# Patient Record
Sex: Female | Born: 1970 | Race: Black or African American | Hispanic: No | Smoking: Never smoker
Health system: Southern US, Community
[De-identification: ages and names within clinical notes are randomized; demographics above are authoritative.]

## PROBLEM LIST (undated history)

## (undated) ENCOUNTER — Telehealth

## (undated) ENCOUNTER — Encounter: Attending: Rheumatology | Primary: Rheumatology

## (undated) ENCOUNTER — Ambulatory Visit

## (undated) ENCOUNTER — Ambulatory Visit: Attending: Ambulatory Care | Primary: Ambulatory Care

## (undated) ENCOUNTER — Ambulatory Visit: Payer: BLUE CROSS/BLUE SHIELD | Attending: Rheumatology | Primary: Rheumatology

## (undated) ENCOUNTER — Ambulatory Visit: Payer: PRIVATE HEALTH INSURANCE

## (undated) ENCOUNTER — Telehealth: Attending: Ambulatory Care | Primary: Ambulatory Care

## (undated) ENCOUNTER — Encounter: Payer: PRIVATE HEALTH INSURANCE | Attending: Rheumatology | Primary: Rheumatology

## (undated) ENCOUNTER — Telehealth: Attending: Rheumatology | Primary: Rheumatology

## (undated) ENCOUNTER — Ambulatory Visit: Payer: PRIVATE HEALTH INSURANCE | Attending: Rheumatology | Primary: Rheumatology

## (undated) ENCOUNTER — Encounter

## (undated) ENCOUNTER — Encounter: Attending: Family | Primary: Family

## (undated) ENCOUNTER — Encounter: Attending: Medical | Primary: Medical

## (undated) DIAGNOSIS — D179 Benign lipomatous neoplasm, unspecified: Secondary | ICD-10-CM

## (undated) DIAGNOSIS — O2230 Deep phlebothrombosis in pregnancy, unspecified trimester: Secondary | ICD-10-CM

## (undated) DIAGNOSIS — B009 Herpesviral infection, unspecified: Secondary | ICD-10-CM

## (undated) DIAGNOSIS — F32A Depression, unspecified: Secondary | ICD-10-CM

## (undated) DIAGNOSIS — K219 Gastro-esophageal reflux disease without esophagitis: Secondary | ICD-10-CM

## (undated) DIAGNOSIS — M069 Rheumatoid arthritis, unspecified: Secondary | ICD-10-CM

## (undated) DIAGNOSIS — J45909 Unspecified asthma, uncomplicated: Secondary | ICD-10-CM

## (undated) DIAGNOSIS — E288 Other ovarian dysfunction: Secondary | ICD-10-CM

## (undated) DIAGNOSIS — I671 Cerebral aneurysm, nonruptured: Secondary | ICD-10-CM

## (undated) DIAGNOSIS — Z9289 Personal history of other medical treatment: Secondary | ICD-10-CM

## (undated) DIAGNOSIS — E785 Hyperlipidemia, unspecified: Secondary | ICD-10-CM

## (undated) DIAGNOSIS — A6000 Herpesviral infection of urogenital system, unspecified: Secondary | ICD-10-CM

## (undated) DIAGNOSIS — M359 Systemic involvement of connective tissue, unspecified: Secondary | ICD-10-CM

## (undated) DIAGNOSIS — S0300XA Dislocation of jaw, unspecified side, initial encounter: Secondary | ICD-10-CM

## (undated) DIAGNOSIS — I2699 Other pulmonary embolism without acute cor pulmonale: Secondary | ICD-10-CM

## (undated) DIAGNOSIS — D219 Benign neoplasm of connective and other soft tissue, unspecified: Secondary | ICD-10-CM

## (undated) DIAGNOSIS — N83209 Unspecified ovarian cyst, unspecified side: Secondary | ICD-10-CM

## (undated) DIAGNOSIS — F329 Major depressive disorder, single episode, unspecified: Secondary | ICD-10-CM

## (undated) HISTORY — DX: Benign lipomatous neoplasm, unspecified: D17.9

## (undated) HISTORY — PX: KNEE SURGERY: SHX244

## (undated) HISTORY — DX: Rheumatoid arthritis, unspecified: M06.9

## (undated) HISTORY — DX: Personal history of other medical treatment: Z92.89

## (undated) HISTORY — DX: Benign neoplasm of connective and other soft tissue, unspecified: D21.9

## (undated) HISTORY — DX: Herpesviral infection of urogenital system, unspecified: A60.00

## (undated) HISTORY — DX: Unspecified asthma, uncomplicated: J45.909

## (undated) HISTORY — DX: Depression, unspecified: F32.A

## (undated) HISTORY — PX: INDUCED ABORTION: SHX677

## (undated) HISTORY — DX: Major depressive disorder, single episode, unspecified: F32.9

## (undated) HISTORY — DX: Dislocation of jaw, unspecified side, initial encounter: S03.00XA

## (undated) HISTORY — DX: Deep phlebothrombosis in pregnancy, unspecified trimester: O22.30

## (undated) HISTORY — DX: Herpesviral infection, unspecified: B00.9

## (undated) HISTORY — DX: Cerebral aneurysm, nonruptured: I67.1

## (undated) HISTORY — PX: WISDOM TOOTH EXTRACTION: SHX21

## (undated) HISTORY — DX: Hyperlipidemia, unspecified: E78.5

## (undated) HISTORY — DX: Gastro-esophageal reflux disease without esophagitis: K21.9

## (undated) HISTORY — DX: Other ovarian dysfunction: E28.8

## (undated) HISTORY — DX: Unspecified ovarian cyst, unspecified side: N83.209

---
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
## 1995-05-21 HISTORY — PX: APPENDECTOMY: SHX54

## 2010-03-15 ENCOUNTER — Other Ambulatory Visit: Payer: Self-pay | Admitting: Family Medicine

## 2010-03-16 ENCOUNTER — Ambulatory Visit: Payer: Self-pay | Admitting: Family Medicine

## 2010-03-20 ENCOUNTER — Ambulatory Visit: Payer: Self-pay | Admitting: Family Medicine

## 2011-04-24 ENCOUNTER — Emergency Department: Payer: Self-pay | Admitting: Emergency Medicine

## 2011-04-24 ENCOUNTER — Ambulatory Visit: Payer: Self-pay

## 2011-05-21 DIAGNOSIS — E2839 Other primary ovarian failure: Secondary | ICD-10-CM

## 2011-05-21 HISTORY — DX: Other primary ovarian failure: E28.39

## 2011-06-25 ENCOUNTER — Ambulatory Visit: Payer: Self-pay | Admitting: Family Medicine

## 2011-06-25 ENCOUNTER — Ambulatory Visit
Admission: RE | Admit: 2011-06-25 | Discharge: 2011-06-25 | Disposition: A | Discharge status: Home / Self Care | Payer: BC Managed Care – PPO | Source: Ambulatory Visit | Attending: Family Medicine | Admitting: Family Medicine

## 2011-06-25 ENCOUNTER — Other Ambulatory Visit: Payer: Self-pay | Admitting: Family Medicine

## 2011-06-25 DIAGNOSIS — R102 Pelvic and perineal pain: Secondary | ICD-10-CM

## 2011-07-04 ENCOUNTER — Ambulatory Visit: Payer: Self-pay | Admitting: Family Medicine

## 2011-07-04 LAB — URINALYSIS, COMPLETE
Bilirubin,UR: NEGATIVE
Leukocyte Esterase: NEGATIVE
Nitrite: NEGATIVE
Ph: 6 (ref 4.5–8.0)
Protein: NEGATIVE
RBC,UR: NONE SEEN /HPF (ref 0–5)

## 2011-07-04 LAB — BASIC METABOLIC PANEL
Calcium, Total: 9.3 mg/dL (ref 8.5–10.1)
Chloride: 104 mmol/L (ref 98–107)
Osmolality: 277 (ref 275–301)
Potassium: 3.8 mmol/L (ref 3.5–5.1)

## 2011-07-04 LAB — WET PREP, GENITAL

## 2012-04-27 ENCOUNTER — Other Ambulatory Visit: Payer: Self-pay | Admitting: Family Medicine

## 2012-04-27 DIAGNOSIS — M25562 Pain in left knee: Secondary | ICD-10-CM

## 2012-04-28 ENCOUNTER — Ambulatory Visit: Payer: Self-pay | Admitting: Family Medicine

## 2012-04-30 ENCOUNTER — Ambulatory Visit
Admission: RE | Admit: 2012-04-30 | Discharge: 2012-04-30 | Disposition: A | Discharge status: Home / Self Care | Payer: BC Managed Care – PPO | Source: Ambulatory Visit | Attending: Family Medicine | Admitting: Family Medicine

## 2012-04-30 DIAGNOSIS — M25562 Pain in left knee: Secondary | ICD-10-CM

## 2013-05-05 ENCOUNTER — Ambulatory Visit: Payer: Self-pay | Admitting: Family Medicine

## 2013-05-05 ENCOUNTER — Ambulatory Visit: Payer: Self-pay | Admitting: Internal Medicine

## 2013-05-05 LAB — COMPREHENSIVE METABOLIC PANEL
Albumin: 3.2 g/dL — ABNORMAL LOW (ref 3.4–5.0)
Alkaline Phosphatase: 44 U/L — ABNORMAL LOW
BUN: 13 mg/dL (ref 7–18)
Bilirubin,Total: 0.4 mg/dL (ref 0.2–1.0)
Creatinine: 0.84 mg/dL (ref 0.60–1.30)
EGFR (Non-African Amer.): >60
Glucose: 105 mg/dL — ABNORMAL HIGH (ref 65–99)
Osmolality: 276 (ref 275–301)
SGOT(AST): 18 U/L (ref 15–37)
Total Protein: 7 g/dL (ref 6.4–8.2)

## 2013-05-05 LAB — CBC WITH DIFFERENTIAL/PLATELET
Eosinophil %: 1.4 %
MCV: 76 fL — ABNORMAL LOW (ref 80–100)
Monocyte %: 5.7 %
Neutrophil %: 66.1 %
RBC: 4.66 10*6/uL (ref 3.80–5.20)

## 2013-05-05 LAB — CK-MB: CK-MB: 1.7 ng/mL (ref 0.5–3.6)

## 2013-05-05 LAB — TROPONIN I: Troponin-I: <0.02 ng/mL

## 2013-05-05 LAB — SEDIMENTATION RATE: Erythrocyte Sed Rate: 23 mm/hr — ABNORMAL HIGH (ref 0–20)

## 2013-05-06 ENCOUNTER — Ambulatory Visit: Payer: Self-pay | Admitting: Internal Medicine

## 2013-05-28 ENCOUNTER — Ambulatory Visit: Payer: BC Managed Care – PPO | Admitting: Adult Health

## 2013-06-04 ENCOUNTER — Ambulatory Visit: Payer: BC Managed Care – PPO | Admitting: Adult Health

## 2013-06-17 ENCOUNTER — Ambulatory Visit (INDEPENDENT_AMBULATORY_CARE_PROVIDER_SITE_OTHER): Payer: BC Managed Care – PPO | Admitting: Adult Health

## 2013-06-17 ENCOUNTER — Encounter: Payer: Self-pay | Admitting: Adult Health

## 2013-06-17 VITALS — BP 98/60 | HR 84 | Temp 97.9°F | Resp 12 | Ht 72.5 in | Wt 171.8 lb

## 2013-06-17 DIAGNOSIS — R894 Abnormal immunological findings in specimens from other organs, systems and tissues: Secondary | ICD-10-CM

## 2013-06-17 DIAGNOSIS — Z1239 Encounter for other screening for malignant neoplasm of breast: Secondary | ICD-10-CM

## 2013-06-17 DIAGNOSIS — D179 Benign lipomatous neoplasm, unspecified: Secondary | ICD-10-CM

## 2013-06-17 DIAGNOSIS — R5383 Other fatigue: Secondary | ICD-10-CM | POA: Insufficient documentation

## 2013-06-17 DIAGNOSIS — K219 Gastro-esophageal reflux disease without esophagitis: Secondary | ICD-10-CM | POA: Insufficient documentation

## 2013-06-17 DIAGNOSIS — R5381 Other malaise: Secondary | ICD-10-CM

## 2013-06-17 DIAGNOSIS — R768 Other specified abnormal immunological findings in serum: Secondary | ICD-10-CM | POA: Insufficient documentation

## 2013-06-17 NOTE — Assessment & Plan Note (Signed)
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 One is on the left posterior shoulder and the right is on the anterior part of shoulder. The right anterior shoulder mass causes her discomfort and decreased ROM. Refer to surgery. Prefers Engineer, drilling. Referral made.

## 2013-06-17 NOTE — Patient Instructions (Signed)
   Thank you for choosing [facility] for your health care needs.  Call and schedule your mammogram.  I am referring you to [facility] for evaluation of your lipomas. We will call you for that appointment. If you have not heard back from our office by Monday, please call and ask to speak to Amber who schedules all our appointments.  I will request records from your previous providers.  For sleep you can try several over the counter products:   Melatonin   Alteril - this has melatonin and valerian root  Sleepy Time Tea - you can find this in the grocery store  Try not to exercise too close to bedtime

## 2013-06-17 NOTE — Assessment & Plan Note (Signed)
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 She is not sleeping well which can also contribute to her symptoms of fatigue. Being evaluated for RA by Dr. Jefm Bryant. She reports having blood work done at [facility] at [place]. Will request records. For sleep, recommend some OTC products. If no improvement then will discuss medication to help.

## 2013-06-17 NOTE — Assessment & Plan Note (Signed)
Provided order for screening mammogram. She will schedule her appointment.

## 2013-06-17 NOTE — Assessment & Plan Note (Signed)
Occasional, mild symptoms. Takes OTC medication such as TUMS which relieves problem.

## 2013-06-17 NOTE — Progress Notes (Signed)
Pre visit review using our clinic review tool, if applicable. No additional management support is needed unless otherwise documented below in the visit note. 

## 2013-06-17 NOTE — Assessment & Plan Note (Signed)
She has painful joints of the hands and feet. Some swelling noted. She is being followed by Dr. Jefm Bryant and has f/u appointment with him on 06/21/13. Will request records.

## 2013-06-17 NOTE — Progress Notes (Signed)
   Subjective:    Patient ID: Vanessa Torres, female    DOB: 12-11-1970, 43 y.o.   MRN: [id number]  HPI   Feeling tired, fatigued. Lots of stress and long hours of work. 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 Not sleeping well.   Review of Systems  Constitutional: Positive for fatigue. Negative for fever, chills, activity change and appetite change.  HENT: Negative.   Eyes: Negative.   Respiratory: Negative.   Cardiovascular: Negative.   Gastrointestinal: Negative.  Negative for nausea and constipation.       Occasional acid reflux.   Endocrine: Negative.   Genitourinary: Negative.   Musculoskeletal: Positive for arthralgias and joint swelling.       Fatty lumps on both shoulders  Skin: Negative.   Allergic/Immunologic: Negative.   Neurological: Negative.   Hematological: Negative.   Psychiatric/Behavioral: Positive for sleep disturbance.       Objective:   Physical Exam  Constitutional: She is oriented to person, place, and time. She appears well-developed and well-nourished. No distress.  HENT:  Head: Normocephalic and atraumatic.  Right Ear: External ear normal.  Left Ear: External ear normal.  Nose: Nose normal.  Mouth/Throat: Oropharynx is clear and moist.  Eyes: Conjunctivae and EOM are normal. Pupils are equal, round, and reactive to light.  Neck: Normal range of motion. Neck supple. No tracheal deviation present. No thyromegaly present.  Cardiovascular: Normal rate, regular rhythm, normal heart sounds and intact distal pulses.  Exam reveals no gallop and no friction rub.   No murmur heard. Pulmonary/Chest: Effort normal and breath sounds normal. No respiratory distress. She has no wheezes. She has no rales.  Abdominal: Soft. Bowel sounds are normal. She exhibits no distension and no mass. There is no tenderness. There is no rebound and no guarding.  Musculoskeletal: Normal range of motion. She exhibits edema and  tenderness.  Tender joints of bilateral hands and feet. Left posterior shoulder with large, soft, movable mass and also on right anterior shoulder; however, this mass is smaller.  Lymphadenopathy:    She has no cervical adenopathy.  Neurological: She is alert and oriented to person, place, and time. She has normal reflexes. No cranial nerve deficit. Coordination normal.  Skin: Skin is warm and dry.  Psychiatric: She has a normal mood and affect. Her behavior is normal. Judgment and thought content normal.          Assessment & Plan:

## 2015-03-27 ENCOUNTER — Ambulatory Visit
Admission: RE | Admit: 2015-03-27 | Discharge: 2015-03-27 | Disposition: A | Discharge status: Home / Self Care | Payer: BLUE CROSS/BLUE SHIELD | Source: Ambulatory Visit | Attending: Family Medicine | Admitting: Family Medicine

## 2015-03-27 ENCOUNTER — Other Ambulatory Visit: Payer: Self-pay | Admitting: *Deleted

## 2015-03-27 DIAGNOSIS — R52 Pain, unspecified: Secondary | ICD-10-CM

## 2015-03-27 DIAGNOSIS — M25561 Pain in right knee: Secondary | ICD-10-CM | POA: Diagnosis not present

## 2015-05-09 ENCOUNTER — Other Ambulatory Visit: Payer: Self-pay | Admitting: Family Medicine

## 2015-05-09 DIAGNOSIS — M7989 Other specified soft tissue disorders: Secondary | ICD-10-CM

## 2015-05-09 DIAGNOSIS — R52 Pain, unspecified: Secondary | ICD-10-CM

## 2015-05-23 ENCOUNTER — Ambulatory Visit
Admission: RE | Admit: 2015-05-23 | Discharge: 2015-05-23 | Disposition: A | Discharge status: Home / Self Care | Payer: BLUE CROSS/BLUE SHIELD | Source: Ambulatory Visit | Attending: Family Medicine | Admitting: Family Medicine

## 2015-05-23 ENCOUNTER — Inpatient Hospital Stay: Admission: RE | Admit: 2015-05-23 | Payer: BLUE CROSS/BLUE SHIELD | Source: Ambulatory Visit

## 2015-05-23 DIAGNOSIS — R52 Pain, unspecified: Secondary | ICD-10-CM

## 2015-05-23 DIAGNOSIS — M7989 Other specified soft tissue disorders: Secondary | ICD-10-CM

## 2015-06-21 ENCOUNTER — Ambulatory Visit
Admission: RE | Admit: 2015-06-21 | Discharge: 2015-06-21 | Disposition: A | Discharge status: Home / Self Care | Payer: BLUE CROSS/BLUE SHIELD | Source: Ambulatory Visit | Attending: Family Medicine | Admitting: Family Medicine

## 2015-06-21 ENCOUNTER — Other Ambulatory Visit: Payer: Self-pay | Admitting: Family Medicine

## 2015-06-21 DIAGNOSIS — R05 Cough: Secondary | ICD-10-CM

## 2015-06-21 DIAGNOSIS — R059 Cough, unspecified: Secondary | ICD-10-CM

## 2015-07-06 ENCOUNTER — Other Ambulatory Visit: Payer: Self-pay | Admitting: Internal Medicine

## 2015-07-06 ENCOUNTER — Ambulatory Visit (INDEPENDENT_AMBULATORY_CARE_PROVIDER_SITE_OTHER): Payer: BLUE CROSS/BLUE SHIELD | Admitting: Internal Medicine

## 2015-07-06 ENCOUNTER — Encounter: Payer: Self-pay | Admitting: Internal Medicine

## 2015-07-06 ENCOUNTER — Encounter (INDEPENDENT_AMBULATORY_CARE_PROVIDER_SITE_OTHER): Payer: Self-pay

## 2015-07-06 VITALS — BP 102/72 | HR 73 | Ht 72.0 in | Wt 159.8 lb

## 2015-07-06 DIAGNOSIS — R0602 Shortness of breath: Secondary | ICD-10-CM | POA: Diagnosis not present

## 2015-07-06 DIAGNOSIS — J309 Allergic rhinitis, unspecified: Secondary | ICD-10-CM

## 2015-07-06 DIAGNOSIS — R062 Wheezing: Secondary | ICD-10-CM

## 2015-07-06 MED ORDER — FLUTICASONE FUROATE-VILANTEROL 100-25 MCG/INH IN AEPB: 1.0000 | INHALATION_SPRAY | Freq: Every day | RESPIRATORY_TRACT | Status: AC

## 2015-07-06 MED ORDER — FLUTICASONE FUROATE-VILANTEROL 100-25 MCG/INH IN AEPB: 1.0000 | INHALATION_SPRAY | Freq: Every day | RESPIRATORY_TRACT | Status: DC

## 2015-07-06 MED ORDER — ALBUTEROL SULFATE HFA 108 (90 BASE) MCG/ACT IN AERS: 2.0000 | INHALATION_SPRAY | RESPIRATORY_TRACT | Status: DC | PRN

## 2015-07-06 MED ORDER — CETIRIZINE HCL 10 MG PO CAPS: 10.0000 mg | ORAL_CAPSULE | Freq: Every day | ORAL | Status: DC

## 2015-07-06 NOTE — Progress Notes (Signed)
[facility] Consultation      Date: 07/06/2015,   MRN# [id number] Vanessa Torres [birth year] Code Status:  Code Status History    This patient does not have a recorded code status. Please follow your organizational policy for patients in this situation.     Hosp day:@LENGTHOFSTAYDAYS @ Referring MD: @ATDPROV @     PCP:      AdmissionWeight: 159 lb 12.8 oz (72.485 kg)                 CurrentWeight: 159 lb 12.8 oz (72.485 kg) Vanessa Torres is a 45 y.o. old female seen in consultation for Wheezing at the request of Dr. Jefm Bryant.     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 She had been on Advair in the past and has used rescue inhalers before She has not had h/o childhood ASTHMA Patient is a non smoker, works as Acupuncturist for [employer], patient has stopped exercising due to chest heaviness and wheezing in the past 2 months Patient states that she has had chest wall tenderness approx 1 month ago, but not now She has also palpitations that have been incurring infrequently over the last several months No signs of infection at this time  Patients last dose of MTX was last week, started on Plaquinel, she has been on MTX for 2 years Her RA seems to be under control at this time, her left leg seems to have little swelling    PAST MEDICAL HISTORY   Past Medical History  Diagnosis Date  . Rheumatoid arthritis ([place])   . Asthma     exercise induced  . Depression     situational  . GERD (gastroesophageal reflux disease)   . Hyperlipidemia   . Premature ovarian  failure 2013    Dr. Cristino Martes, GYN. Symptoms have resolved  . Herpes genitalia 1992     SURGICAL HISTORY   Past Surgical History  Procedure Laterality Date  . Knee surgery Bilateral     x2 each knee  . Appendectomy  1997     FAMILY HISTORY   Family History  Problem Relation Age of Onset  . Cancer Father     prostate cancer  . Hyperlipidemia Father   . Hypertension Father   . Diabetes Father   . Heart disease Father     Died of HF  . Hyperlipidemia Brother   . Heart disease Maternal Grandmother     CAD - died  . Early death Mother 53    Died of complication from double pna     SOCIAL HISTORY   Social History  Substance Use Topics  . Smoking status: Never Smoker   . Smokeless tobacco: Never Used  . Alcohol Use: No     MEDICATIONS    Home Medication:  Current Outpatient Rx  Name  Route  Sig  Dispense  Refill  . acyclovir (ZOVIRAX) 400 MG tablet   Oral   Take 400 mg by mouth daily.         Marland Kitchen albuterol (PROAIR HFA) 108 (90 Base)  MCG/ACT inhaler               . cholecalciferol (VITAMIN D) 1000 UNITS tablet   Oral   Take 1,000 Units by mouth daily.         . folic acid (FOLVITE) 1 MG tablet   Oral   Take by mouth.         . hydroxychloroquine (PLAQUENIL) 200 MG tablet            3   . methotrexate (RHEUMATREX) 2.5 MG tablet   Oral   Take by mouth.         . montelukast (SINGULAIR) 10 MG tablet   Oral   Take by mouth.         . Multiple Vitamin (MULTIVITAMIN) tablet   Oral   Take 1 tablet by mouth daily.         . Omega-3 Fatty Acids (FISH OIL) 1200 MG CAPS   Oral   Take 1 capsule by mouth daily.         Marland Kitchen albuterol (PROVENTIL HFA;VENTOLIN HFA) 108 (90 Base) MCG/ACT inhaler   Inhalation   Inhale 2 puffs into the lungs every 4 (four) hours as needed for wheezing or shortness of breath.   1 Inhaler   10   . Cetirizine HCl 10 MG CAPS   Oral   Take 1 capsule (10 mg total) by mouth at bedtime.   30 capsule   10       Current Medication:  Current outpatient prescriptions:  .  acyclovir (ZOVIRAX) 400 MG tablet, Take 400 mg by mouth daily., Disp: , Rfl:  .  albuterol (PROAIR HFA) 108 (90 Base) MCG/ACT inhaler, , Disp: , Rfl:  .  cholecalciferol (VITAMIN D) 1000 UNITS tablet, Take 1,000 Units by mouth daily., Disp: , Rfl:  .  folic acid (FOLVITE) 1 MG tablet, Take by mouth., Disp: , Rfl:  .  hydroxychloroquine (PLAQUENIL) 200 MG tablet, , Disp: , Rfl: 3 .  methotrexate (RHEUMATREX) 2.5 MG tablet, Take by mouth., Disp: , Rfl:  .  montelukast (SINGULAIR) 10 MG tablet, Take by mouth., Disp: , Rfl:  .  Multiple Vitamin (MULTIVITAMIN) tablet, Take 1 tablet by mouth daily., Disp: , Rfl:  .  Omega-3 Fatty Acids (FISH OIL) 1200 MG CAPS, Take 1 capsule by mouth daily., Disp: , Rfl:  .  albuterol (PROVENTIL HFA;VENTOLIN HFA) 108 (90 Base) MCG/ACT inhaler, Inhale 2 puffs into the lungs every 4 (four) hours as needed for wheezing or shortness of breath., Disp: 1 Inhaler, Rfl: 10 .  Cetirizine HCl 10 MG CAPS, Take 1 capsule (10 mg total) by mouth at bedtime., Disp: 30 capsule, Rfl: 10    ALLERGIES   Review of patient's allergies indicates no known allergies.     REVIEW OF SYSTEMS   Review of Systems  Constitutional: Negative for fever, chills, weight loss, malaise/fatigue and diaphoresis.  HENT: Negative for congestion and hearing loss.   Eyes: Negative for blurred vision and double vision.  Respiratory: Positive for cough and wheezing. Negative for hemoptysis, sputum production and shortness of breath.   Cardiovascular: Positive for palpitations and leg swelling. Negative for chest pain and orthopnea.  Gastrointestinal: Negative for heartburn, nausea, vomiting and abdominal pain.  Genitourinary: Negative for dysuria and urgency.  Musculoskeletal: Negative for myalgias, back pain and neck pain.  Skin: Negative for rash.  Neurological: Negative for dizziness, tingling, weakness and headaches.   Endo/Heme/Allergies: Does not bruise/bleed easily.  Psychiatric/Behavioral: Negative for depression.  The patient is not nervous/anxious.   All other systems reviewed and are negative.    VS: BP 102/72 mmHg  Pulse 73  Ht 6' (1.829 m)  Wt 159 lb 12.8 oz (72.485 kg)  BMI 21.67 kg/m2  SpO2 98%  LMP 05/31/2015 (Approximate)     PHYSICAL EXAM  Physical Exam  Constitutional: She is oriented to person, place, and time. She appears well-developed and well-nourished. No distress.  HENT:  Head: Normocephalic and atraumatic.  Mouth/Throat: No oropharyngeal exudate.  Eyes: EOM are normal. Pupils are equal, round, and reactive to light. No scleral icterus.  Neck: Normal range of motion. Neck supple.  Cardiovascular: Normal rate, regular rhythm and normal heart sounds.   No murmur heard. Pulmonary/Chest: No stridor. No respiratory distress. She has no wheezes. She has no rales.  Abdominal: Soft. Bowel sounds are normal. She exhibits no distension. There is no tenderness. There is no rebound.  Musculoskeletal: Normal range of motion. She exhibits no edema.  Left leg slightly swollen, muscle pain upon touch  Neurological: She is alert and oriented to person, place, and time. She displays normal reflexes. Coordination normal.  Skin: Skin is warm. She is not diaphoretic.  Psychiatric: She has a normal mood and affect.              IMAGING    Dg Chest 2 View  06/21/2015  CLINICAL DATA:  Productive cough for 1 month. EXAM: CHEST  2 VIEW COMPARISON:  CT chest 05/06/2013 and chest radiograph 05/05/2013. FINDINGS: Trachea is midline. Heart size normal. Lungs appear somewhat hyperinflated but clear. No pleural fluid. IMPRESSION: Hyperinflation without acute finding. Electronically Signed   By: Lorin Picket M.D.   On: 06/21/2015 12:42    CXR images Reveiwed 07/06/2015    ASSESSMENT/PLAN  45 yo AAF with signs and symptoms of Bronchitis with cough and wheezing c/w Reactive airways disease  c/w ASTHMA She will need aggressive inhaler therapy at this time  1.start BREO(inhaled steroid and LABA) 2.albuterol as needed for wheezing 3.start Zyrtec daily 4.hold MTX at this time 5.will follow up in 3 weeks for PFT's  Will consider CT imaging if symptoms persist   I have personally obtained a history, examined the patient, evaluated laboratory and independently reviewed imaging results. The Patient requires high complexity decision making for assessment and support, frequent evaluation and titration of therapies, application of advanced monitoring technologies and extensive interpretation of multiple databases.   Patient  satisfied with Plan of action and management. All questions answered  Corrin Parker, M.D.  [facility]  Medical Director [place] Director [facility] Cardio-Pulmonary Department

## 2015-07-06 NOTE — Patient Instructions (Signed)
Shortness of Breath Shortness of breath means you have trouble breathing. It could also mean that you have a medical problem. You should get immediate medical care for shortness of breath. CAUSES   Not enough oxygen in the air such as with high altitudes or a smoke-filled room.  Certain lung diseases, infections, or problems.  Heart disease or conditions, such as angina or heart failure.  Low red blood cells (anemia).  Poor physical fitness, which can cause shortness of breath when you exercise.  Chest or back injuries or stiffness.  Being overweight.  Smoking.  Anxiety, which can make you feel like you are not getting enough air. DIAGNOSIS  Serious medical problems can often be found during your physical exam. Tests may also be done to determine why you are having shortness of breath. Tests may include:  Chest X-rays.  Lung function tests.  Blood tests.  An electrocardiogram (ECG).  An ambulatory electrocardiogram. An ambulatory ECG records your heartbeat patterns over a 24-hour period.  Exercise testing.  A transthoracic echocardiogram (TTE). During echocardiography, sound waves are used to evaluate how blood flows through your heart.  A transesophageal echocardiogram (TEE).  Imaging scans. Your health care provider may not be able to find a cause for your shortness of breath after your exam. In this case, it is important to have a follow-up exam with your health care provider as directed.  TREATMENT  Treatment for shortness of breath depends on the cause of your symptoms and can vary greatly. HOME CARE INSTRUCTIONS   Do not smoke. Smoking is a common cause of shortness of breath. If you smoke, ask for help to quit.  Avoid being around chemicals or things that may bother your breathing, such as paint fumes and dust.  Rest as needed. Slowly resume your usual activities.  If medicines were prescribed, take them as directed for the full length of time directed. This  includes oxygen and any inhaled medicines.  Keep all follow-up appointments as directed by your health care provider. SEEK MEDICAL CARE IF:   Your condition does not improve in the time expected.  You have a hard time doing your normal activities even with rest.  You have any new symptoms. SEEK IMMEDIATE MEDICAL CARE IF:   Your shortness of breath gets worse.  You feel light-headed, faint, or develop a cough not controlled with medicines.  You start coughing up blood.  You have pain with breathing.  You have chest pain or pain in your arms, shoulders, or abdomen.  You have a fever.  You are unable to walk up stairs or exercise the way you normally do. MAKE SURE YOU:  Understand these instructions.  Will watch your condition.  Will get help right away if you are not doing well or get worse.   This information is not intended to replace advice given to you by your health care provider. Make sure you discuss any questions you have with your health care provider.   Document Released: 01/29/2001 Document Revised: 05/11/2013 Document Reviewed: 07/22/2011 Elsevier Interactive Patient Education 2016 [place] 1 PUFF DAILY ALBUTEROL is RECSUE INHALER 2 puufs every 4 hrs as needed for wheezing and cough TAKE ZYRTEC AT NIGHT DAILY

## 2015-07-25 ENCOUNTER — Ambulatory Visit: Payer: BLUE CROSS/BLUE SHIELD | Attending: Internal Medicine

## 2015-07-25 DIAGNOSIS — R0602 Shortness of breath: Secondary | ICD-10-CM | POA: Insufficient documentation

## 2015-07-31 ENCOUNTER — Encounter: Payer: Self-pay | Admitting: Internal Medicine

## 2015-07-31 ENCOUNTER — Ambulatory Visit (INDEPENDENT_AMBULATORY_CARE_PROVIDER_SITE_OTHER): Payer: BLUE CROSS/BLUE SHIELD | Admitting: Internal Medicine

## 2015-07-31 VITALS — BP 104/68 | HR 73 | Ht 72.0 in | Wt 163.4 lb

## 2015-07-31 DIAGNOSIS — J209 Acute bronchitis, unspecified: Secondary | ICD-10-CM

## 2015-07-31 NOTE — Patient Instructions (Signed)

## 2015-07-31 NOTE — Progress Notes (Signed)
[facility] Consultation      Date: 07/31/2015,   MRN# [id number] Vanessa Torres September 17, 1970 Code Status:  Code Status History    This patient does not have a recorded code status. Please follow your organizational policy for patients in this situation.     Hosp day:@LENGTHOFSTAYDAYS @ Referring MD: @ATDPROV @     PCP:      AdmissionWeight: 163 lb 6.4 oz (74.118 kg)                 CurrentWeight: 163 lb 6.4 oz (74.118 kg) Vanessa Torres is a 45 y.o. old female seen in consultation for Wheezing at the request of Dr. Jefm Bryant.     CHIEF COMPLAINT:   Follow up Wheezing and cough   HISTORY OF PRESENT ILLNESS   45 yo AAF with Dx of RA 2 years ago,  Patient feelsl well today, no resp symptoms at this time No fevers, chills. No SOB, wheezing Has not used albuterol inhaler in last month No signs of infection   PFT 07/2015  WNL  ratio 76% FEV1 90%, FVC 92% DLCO 113% TLC 5.7 L 86%   Current Medication:  Current outpatient prescriptions:  .  acyclovir (ZOVIRAX) 400 MG tablet, Take 400 mg by mouth daily., Disp: , Rfl:  .  albuterol (PROAIR HFA) 108 (90 Base) MCG/ACT inhaler, , Disp: , Rfl:  .  albuterol (PROVENTIL HFA;VENTOLIN HFA) 108 (90 Base) MCG/ACT inhaler, Inhale 2 puffs into the lungs every 4 (four) hours as needed for wheezing or shortness of breath., Disp: 1 Inhaler, Rfl: 10 .  Cetirizine HCl 10 MG CAPS, Take 1 capsule (10 mg total) by mouth at bedtime., Disp: 30 capsule, Rfl: 10 .  cholecalciferol (VITAMIN D) 1000 UNITS tablet, Take 1,000 Units by mouth daily., Disp: , Rfl:  .  fluticasone furoate-vilanterol (BREO ELLIPTA) 100-25 MCG/INH AEPB, Inhale 1 puff into the lungs daily., Disp: 60 each, Rfl: 5 .  folic acid (FOLVITE) 1 MG tablet, Take by mouth., Disp: , Rfl:  .  hydroxychloroquine (PLAQUENIL) 200 MG tablet, , Disp: , Rfl: 3 .  methotrexate (RHEUMATREX) 2.5 MG tablet, Take by mouth., Disp: , Rfl:  .  montelukast (SINGULAIR) 10 MG tablet, Take  by mouth., Disp: , Rfl:  .  Multiple Vitamin (MULTIVITAMIN) tablet, Take 1 tablet by mouth daily., Disp: , Rfl:  .  Omega-3 Fatty Acids (FISH OIL) 1200 MG CAPS, Take 1 capsule by mouth daily., Disp: , Rfl:     ALLERGIES   Review of patient's allergies indicates no known allergies.     REVIEW OF SYSTEMS   Review of Systems  Constitutional: Negative for fever, chills, weight loss and malaise/fatigue.  HENT: Negative for congestion.   Respiratory: Negative for cough, hemoptysis, sputum production, shortness of breath and wheezing.   Cardiovascular: Negative for chest pain, palpitations, orthopnea and leg swelling.  Gastrointestinal: Negative for heartburn, nausea, vomiting and abdominal pain.  Musculoskeletal: Positive for joint pain.  Psychiatric/Behavioral: The patient is not nervous/anxious.   All other systems reviewed and are negative.    VS: BP 104/68 mmHg  Pulse 73  Ht 6' (1.829 m)  Wt 163 lb 6.4 oz (74.118 kg)  BMI 22.16 kg/m2  SpO2 98%     PHYSICAL EXAM  Physical Exam  Constitutional: She is oriented to person, place, and time. She appears well-developed and well-nourished. No distress.  HENT:  Head: Normocephalic and atraumatic.  Eyes: Pupils are equal, round, and reactive to light.  Neck: Normal range of motion.  Neck supple.  Cardiovascular: Normal rate, regular rhythm and normal heart sounds.   No murmur heard. Pulmonary/Chest: Effort normal and breath sounds normal. No respiratory distress. She has no wheezes.  Musculoskeletal: Normal range of motion.  Left hand joint pain  Neurological: She is alert and oriented to person, place, and time.  Skin: Skin is warm. She is not diaphoretic.  Psychiatric: She has a normal mood and affect.       ASSESSMENT/PLAN  45 yo AAF with signs and symptoms of Bronchitis with cough and wheezing c/w Reactive airways disease c/w ASTHMA Patient has clinically improved, Resp symptoms have resolved  1.stop   BREO 2.albuterol as needed for wheezing 3.continue  Zyrtec daily 4.hold MTX at this time 5.follow up with Dr Jefm Bryant as needed  No need for CT chest at this time  The Patient requires high complexity decision making for assessment and support, frequent evaluation and titration of therapies, application of advanced monitoring technologies and extensive interpretation of multiple databases.   Patient  satisfied with Plan of action and management. All questions answered  Corrin Parker, M.D.  [facility]  Medical Director [place] Director [facility] Cardio-Pulmonary Department

## 2015-07-31 NOTE — Progress Notes (Signed)
[facility] Consultation      Date: 07/31/2015,   MRN# [id number] Vanessa Torres Jun 10, 1970 Code Status:  Code Status History    This patient does not have a recorded code status. Please follow your organizational policy for patients in this situation.     Hosp day:@LENGTHOFSTAYDAYS @ Referring MD: @ATDPROV @     PCP:      AdmissionWeight: 163 lb 6.4 oz (74.118 kg)                 CurrentWeight: 163 lb 6.4 oz (74.118 kg) Vanessa Torres is a 45 y.o. old female seen in consultation for Wheezing at the request of Dr. Jefm Bryant.     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 She had been on Advair in the past and has used rescue inhalers before She has not had h/o childhood ASTHMA Patient is a non smoker, works as Acupuncturist for [employer], patient has stopped exercising due to chest heaviness and wheezing in the past 2 months Patient states that she has had chest wall tenderness approx 1 month ago, but not now She has also palpitations that have been incurring infrequently over the last several months No signs of infection at this time  Patients last dose of MTX was last week, started on Plaquinel, she has been on MTX for 2 years Her RA seems to be under control at this time, her left leg seems to have little swelling    PAST MEDICAL HISTORY   Past Medical History  Diagnosis Date  . Rheumatoid arthritis ([place])   . Asthma     exercise induced  . Depression     situational  . GERD (gastroesophageal reflux disease)   . Hyperlipidemia   . Premature ovarian  failure 2013    Dr. Cristino Martes, GYN. Symptoms have resolved  . Herpes genitalia 1992     SURGICAL HISTORY   Past Surgical History  Procedure Laterality Date  . Knee surgery Bilateral     x2 each knee  . Appendectomy  1997     FAMILY HISTORY   Family History  Problem Relation Age of Onset  . Cancer Father     prostate cancer  . Hyperlipidemia Father   . Hypertension Father   . Diabetes Father   . Heart disease Father     Died of HF  . Hyperlipidemia Brother   . Heart disease Maternal Grandmother     CAD - died  . Early death Mother 82    Died of complication from double pna     SOCIAL HISTORY   Social History  Substance Use Topics  . Smoking status: Never Smoker   . Smokeless tobacco: Never Used  . Alcohol Use: No     MEDICATIONS    Home Medication:  Current Outpatient Rx  Name  Route  Sig  Dispense  Refill  . acyclovir (ZOVIRAX) 400 MG tablet   Oral   Take 400 mg by mouth daily.         Marland Kitchen albuterol (PROAIR HFA) 108 (90 Base)  MCG/ACT inhaler               . albuterol (PROVENTIL HFA;VENTOLIN HFA) 108 (90 Base) MCG/ACT inhaler   Inhalation   Inhale 2 puffs into the lungs every 4 (four) hours as needed for wheezing or shortness of breath.   1 Inhaler   10   . Cetirizine HCl 10 MG CAPS   Oral   Take 1 capsule (10 mg total) by mouth at bedtime.   30 capsule   10   . cholecalciferol (VITAMIN D) 1000 UNITS tablet   Oral   Take 1,000 Units by mouth daily.         . fluticasone furoate-vilanterol (BREO ELLIPTA) 100-25 MCG/INH AEPB   Inhalation   Inhale 1 puff into the lungs daily.   60 each   5   . folic acid (FOLVITE) 1 MG tablet   Oral   Take by mouth.         . hydroxychloroquine (PLAQUENIL) 200 MG tablet            3   . methotrexate (RHEUMATREX) 2.5 MG tablet   Oral   Take by mouth.         . montelukast (SINGULAIR) 10 MG tablet   Oral   Take by mouth.         . Multiple Vitamin (MULTIVITAMIN) tablet   Oral   Take  1 tablet by mouth daily.         . Omega-3 Fatty Acids (FISH OIL) 1200 MG CAPS   Oral   Take 1 capsule by mouth daily.           Current Medication:  Current outpatient prescriptions:  .  acyclovir (ZOVIRAX) 400 MG tablet, Take 400 mg by mouth daily., Disp: , Rfl:  .  albuterol (PROAIR HFA) 108 (90 Base) MCG/ACT inhaler, , Disp: , Rfl:  .  albuterol (PROVENTIL HFA;VENTOLIN HFA) 108 (90 Base) MCG/ACT inhaler, Inhale 2 puffs into the lungs every 4 (four) hours as needed for wheezing or shortness of breath., Disp: 1 Inhaler, Rfl: 10 .  Cetirizine HCl 10 MG CAPS, Take 1 capsule (10 mg total) by mouth at bedtime., Disp: 30 capsule, Rfl: 10 .  cholecalciferol (VITAMIN D) 1000 UNITS tablet, Take 1,000 Units by mouth daily., Disp: , Rfl:  .  fluticasone furoate-vilanterol (BREO ELLIPTA) 100-25 MCG/INH AEPB, Inhale 1 puff into the lungs daily., Disp: 60 each, Rfl: 5 .  folic acid (FOLVITE) 1 MG tablet, Take by mouth., Disp: , Rfl:  .  hydroxychloroquine (PLAQUENIL) 200 MG tablet, , Disp: , Rfl: 3 .  methotrexate (RHEUMATREX) 2.5 MG tablet, Take by mouth., Disp: , Rfl:  .  montelukast (SINGULAIR) 10 MG tablet, Take by mouth., Disp: , Rfl:  .  Multiple Vitamin (MULTIVITAMIN) tablet, Take 1 tablet by mouth daily., Disp: , Rfl:  .  Omega-3 Fatty Acids (FISH OIL) 1200 MG CAPS, Take 1 capsule by mouth daily., Disp: , Rfl:     ALLERGIES   Review of patient's allergies indicates no known allergies.     REVIEW OF SYSTEMS   Review of Systems  Constitutional: Negative for fever, chills, weight loss, malaise/fatigue and diaphoresis.  HENT: Negative for congestion and hearing loss.   Eyes: Negative for blurred vision and double vision.  Respiratory: Positive for cough and wheezing. Negative for hemoptysis, sputum production and shortness of breath.   Cardiovascular: Positive for palpitations and leg swelling. Negative for chest pain and orthopnea.  Gastrointestinal: Negative for  heartburn,  nausea, vomiting and abdominal pain.  Genitourinary: Negative for dysuria and urgency.  Musculoskeletal: Negative for myalgias, back pain and neck pain.  Skin: Negative for rash.  Neurological: Negative for dizziness, tingling, weakness and headaches.  Endo/Heme/Allergies: Does not bruise/bleed easily.  Psychiatric/Behavioral: Negative for depression. The patient is not nervous/anxious.   All other systems reviewed and are negative.    VS: BP 104/68 mmHg  Pulse 73  Ht 6' (1.829 m)  Wt 163 lb 6.4 oz (74.118 kg)  BMI 22.16 kg/m2  SpO2 98%     PHYSICAL EXAM  Physical Exam  Constitutional: She is oriented to person, place, and time. She appears well-developed and well-nourished. No distress.  HENT:  Head: Normocephalic and atraumatic.  Mouth/Throat: No oropharyngeal exudate.  Eyes: EOM are normal. Pupils are equal, round, and reactive to light. No scleral icterus.  Neck: Normal range of motion. Neck supple.  Cardiovascular: Normal rate, regular rhythm and normal heart sounds.   No murmur heard. Pulmonary/Chest: No stridor. No respiratory distress. She has no wheezes. She has no rales.  Abdominal: Soft. Bowel sounds are normal. She exhibits no distension. There is no tenderness. There is no rebound.  Musculoskeletal: Normal range of motion. She exhibits no edema.  Left leg slightly swollen, muscle pain upon touch  Neurological: She is alert and oriented to person, place, and time. She displays normal reflexes. Coordination normal.  Skin: Skin is warm. She is not diaphoretic.  Psychiatric: She has a normal mood and affect.                 ASSESSMENT/PLAN  45 yo AAF with signs and symptoms of Bronchitis with cough and wheezing c/w Reactive airways disease c/w ASTHMA She will need aggressive inhaler therapy at this time  1.start BREO(inhaled steroid and LABA) 2.albuterol as needed for wheezing 3.start Zyrtec daily 4.hold MTX at this time 5.will follow up in 3 weeks for  PFT's  Will consider CT imaging if symptoms persist   I have personally obtained a history, examined the patient, evaluated laboratory and independently reviewed imaging results. The Patient requires high complexity decision making for assessment and support, frequent evaluation and titration of therapies, application of advanced monitoring technologies and extensive interpretation of multiple databases.   Patient  satisfied with Plan of action and management. All questions answered  Corrin Parker, M.D.  [facility]  Medical Director [place] Director [facility] Cardio-Pulmonary Department

## 2015-11-18 DIAGNOSIS — I2699 Other pulmonary embolism without acute cor pulmonale: Secondary | ICD-10-CM

## 2015-11-18 HISTORY — PX: LIPOMA EXCISION: SHX5283

## 2015-11-18 HISTORY — DX: Other pulmonary embolism without acute cor pulmonale: I26.99

## 2015-12-19 DIAGNOSIS — O2230 Deep phlebothrombosis in pregnancy, unspecified trimester: Secondary | ICD-10-CM

## 2015-12-19 HISTORY — DX: Deep phlebothrombosis in pregnancy, unspecified trimester: O22.30

## 2016-01-09 ENCOUNTER — Encounter: Payer: Self-pay | Admitting: Oncology

## 2016-01-09 ENCOUNTER — Inpatient Hospital Stay: Payer: BLUE CROSS/BLUE SHIELD | Attending: Oncology | Admitting: Oncology

## 2016-01-09 ENCOUNTER — Encounter (INDEPENDENT_AMBULATORY_CARE_PROVIDER_SITE_OTHER): Payer: Self-pay

## 2016-01-09 ENCOUNTER — Inpatient Hospital Stay: Payer: BLUE CROSS/BLUE SHIELD

## 2016-01-09 VITALS — BP 111/73 | HR 66 | Temp 97.7°F | Resp 18 | Wt 165.1 lb

## 2016-01-09 DIAGNOSIS — M069 Rheumatoid arthritis, unspecified: Secondary | ICD-10-CM | POA: Insufficient documentation

## 2016-01-09 DIAGNOSIS — J45990 Exercise induced bronchospasm: Secondary | ICD-10-CM | POA: Diagnosis not present

## 2016-01-09 DIAGNOSIS — Z79899 Other long term (current) drug therapy: Secondary | ICD-10-CM

## 2016-01-09 DIAGNOSIS — E785 Hyperlipidemia, unspecified: Secondary | ICD-10-CM | POA: Diagnosis not present

## 2016-01-09 DIAGNOSIS — Z7901 Long term (current) use of anticoagulants: Secondary | ICD-10-CM | POA: Insufficient documentation

## 2016-01-09 DIAGNOSIS — Z7951 Long term (current) use of inhaled steroids: Secondary | ICD-10-CM | POA: Diagnosis not present

## 2016-01-09 DIAGNOSIS — I2699 Other pulmonary embolism without acute cor pulmonale: Secondary | ICD-10-CM

## 2016-01-09 DIAGNOSIS — A6000 Herpesviral infection of urogenital system, unspecified: Secondary | ICD-10-CM | POA: Insufficient documentation

## 2016-01-09 DIAGNOSIS — K219 Gastro-esophageal reflux disease without esophagitis: Secondary | ICD-10-CM | POA: Insufficient documentation

## 2016-01-09 DIAGNOSIS — Z8042 Family history of malignant neoplasm of prostate: Secondary | ICD-10-CM | POA: Diagnosis not present

## 2016-01-09 DIAGNOSIS — F329 Major depressive disorder, single episode, unspecified: Secondary | ICD-10-CM | POA: Insufficient documentation

## 2016-01-09 LAB — ANTITHROMBIN III: AntiThromb III Func: 102 % (ref 75–120)

## 2016-01-10 LAB — LUPUS ANTICOAGULANT PANEL
DRVVT: 41.3 s (ref 0.0–47.0)
PTT Lupus Anticoagulant: 32.8 s (ref 0.0–51.9)

## 2016-01-10 LAB — PROTEIN S, TOTAL: PROTEIN S AG TOTAL: 178 % — AB (ref 60–150)

## 2016-01-10 LAB — HOMOCYSTEINE: HOMOCYSTEINE-NORM: 8.1 umol/L (ref 0.0–15.0)

## 2016-01-10 LAB — PROTEIN S ACTIVITY: Protein S Activity: 101 % (ref 63–140)

## 2016-01-10 LAB — PROTEIN C ACTIVITY: PROTEIN C ACTIVITY: 142 % (ref 73–180)

## 2016-01-10 NOTE — Progress Notes (Signed)
[name]  [phone] [phone]  ID: [name] OB: 04-04-1971  MR#: [id number]  [id number]  Patient Care Team: No Pcp Per Patient as PCP - General (General Practice)  CHIEF COMPLAINT: Bilateral pulmonary embolism.  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 Upon evaluation in hospital, she was found to have bilateral PE and was subsequently placed on Eliquis. She is also taking birth control pills at that time. She currently feels well and that her baseline. She has no further chest pain or shortness of breath. She has no neurologic complaints. She denies any recent fevers or illnesses. She has good appetite and denies weight loss. She has no nausea, vomiting, constipation, or diarrhea. She has no urinary complaints. Patient otherwise feels well and offers no further specific complaints.  REVIEW OF SYSTEMS:   Review of Systems  Constitutional: Negative.  Negative for fever, malaise/fatigue and weight loss.  Respiratory: Negative.  Negative for cough, hemoptysis and shortness of breath.   Cardiovascular: Negative.  Negative for chest pain and leg swelling.  Gastrointestinal: Negative.  Negative for abdominal pain.  Genitourinary: Negative.   Musculoskeletal: Negative.   Neurological: Negative.  Negative for weakness.  Endo/Heme/Allergies: Does not bruise/bleed easily.  Psychiatric/Behavioral: Negative.  The patient is not nervous/anxious.     As per HPI. Otherwise, a complete review of systems is negatve.  PAST MEDICAL HISTORY: Past Medical History:  Diagnosis Date  . Asthma    exercise induced  . Depression    situational  . GERD (gastroesophageal reflux disease)   . Herpes genitalia 1992  . Hyperlipidemia   . Premature ovarian failure 2013   Dr. Cristino Martes, GYN. Symptoms have resolved  . Rheumatoid  arthritis ([place])     PAST SURGICAL HISTORY: Past Surgical History:  Procedure Laterality Date  . APPENDECTOMY  1997  . KNEE SURGERY Bilateral    x2 each knee    FAMILY HISTORY: Family History  Problem Relation Age of Onset  . Cancer Father     prostate cancer  . Hyperlipidemia Father   . Hypertension Father   . Diabetes Father   . Heart disease Father     Died of HF  . Hyperlipidemia Brother   . Heart disease Maternal Grandmother     CAD - died  . Early death Mother 85    Died of complication from double pna       ADVANCED DIRECTIVES (Y/N):  N   HEALTH MAINTENANCE: Social History  Substance Use Topics  . Smoking status: Never Smoker  . Smokeless tobacco: Never Used  . Alcohol use No     Colonoscopy:  PAP:  Bone density:  Lipid panel:  No Known Allergies  Current Outpatient Prescriptions  Medication Sig Dispense Refill  . acyclovir (ZOVIRAX) 400 MG tablet Take 400 mg by mouth daily.    Marland Kitchen albuterol (PROAIR HFA) 108 (90 Base) MCG/ACT inhaler     . albuterol (PROVENTIL HFA;VENTOLIN HFA) 108 (90 Base) MCG/ACT inhaler Inhale 2 puffs into the lungs every 4 (four) hours as needed for wheezing or shortness of breath. (Patient not taking: Reported on 01/09/2016) 1 Inhaler 10  . Cetirizine HCl 10 MG CAPS Take 1 capsule (10 mg total) by mouth at bedtime. (Patient not taking: Reported on 01/09/2016) 30 capsule 10  . cholecalciferol (VITAMIN D) 1000 UNITS tablet Take 1,000 Units by mouth daily.    Marland Kitchen  ELIQUIS 5 MG TABS tablet Take 5 mg by mouth 2 (two) times daily.  1  . fluticasone furoate-vilanterol (BREO ELLIPTA) 100-25 MCG/INH AEPB Inhale 1 puff into the lungs daily. (Patient not taking: Reported on 01/09/2016) 60 each 5  . folic acid (FOLVITE) 1 MG tablet Take by mouth.    . hydroxychloroquine (PLAQUENIL) 200 MG tablet   3  . methotrexate (RHEUMATREX) 2.5 MG tablet Take by mouth.    . montelukast (SINGULAIR) 10 MG tablet Take by mouth.    . Multiple Vitamin (MULTIVITAMIN)  tablet Take 1 tablet by mouth daily.    . Omega-3 Fatty Acids (FISH OIL) 1200 MG CAPS Take 1 capsule by mouth daily.     No current facility-administered medications for this visit.     OBJECTIVE: Vitals:   01/09/16 1020  BP: 111/73  Pulse: 66  Resp: 18  Temp: 97.7 F (36.5 C)     Body mass index is 22.39 kg/m.    ECOG FS:0 - Asymptomatic  General: Well-developed, well-nourished, no acute distress. Eyes: Pink conjunctiva, anicteric sclera. HEENT: Normocephalic, moist mucous membranes, clear oropharnyx. Lungs: Clear to auscultation bilaterally. Heart: Regular rate and rhythm. No rubs, murmurs, or gallops. Abdomen: Soft, nontender, nondistended. No organomegaly noted, normoactive bowel sounds. Musculoskeletal: No edema, cyanosis, or clubbing. Neuro: Alert, answering all questions appropriately. Cranial nerves grossly intact. Skin: No rashes or petechiae noted. Psych: Normal affect. Lymphatics: No cervical, calvicular, axillary or inguinal LAD.   LAB RESULTS:  Lab Results  Component Value Date   NA 138 05/05/2013   K 3.7 05/05/2013   CL 103 05/05/2013   CO2 25 05/05/2013   GLUCOSE 105 (H) 05/05/2013   BUN 13 05/05/2013   CREATININE 0.84 05/05/2013   CALCIUM 9.3 05/05/2013   PROT 7.0 05/05/2013   ALBUMIN 3.2 (L) 05/05/2013   AST 18 05/05/2013   ALT 23 05/05/2013   ALKPHOS 44 (L) 05/05/2013   BILITOT 0.4 05/05/2013   GFRNONAA >60 05/05/2013   GFRAA >60 05/05/2013    Lab Results  Component Value Date   WBC 7.5 05/05/2013   NEUTROABS 4.9 05/05/2013   HGB 11.5 (L) 05/05/2013   HCT 35.3 05/05/2013   MCV 76 (L) 05/05/2013   PLT 192 05/05/2013     STUDIES: No results found.  ASSESSMENT: Bilateral pulmonary embolism.  PLAN:    1. Bilateral pulmonary embolism: Patient had multiple transient risk factors included extended traveled to [place] as well as on birth control pills. She also has family history of DVT. Full hypercoagulable workup was ordered today and is  pending at time of dictation. Patient has been instructed that she will require a minimum of 3 months of Eliquis. She will return to clinic at the end of October to discuss her laboratory results and determine whether additional treatment is necessary. If anticoagulation is stopped, will further discuss transient risk factors and prophylactic measures patient can possibly take. Given that she travels extensively with her basketball team, she may benefit from Lovenox PRN for extended trips.  Approximate 45 minutes was spent in discussion of which greater than 50% was consultation.  Patient expressed understanding and was in agreement with this plan. She also understands that She can call clinic at any time with any questions, concerns, or complaints.    Lloyd Huger, MD   01/10/2016 10:19 PM

## 2016-01-11 LAB — CARDIOLIPIN ANTIBODIES, IGG, IGM, IGA
ANTICARDIOLIPIN IGM: 12 [MPL'U]/mL (ref 0–12)
Anticardiolipin IgA: <9 APL U/mL (ref 0–11)

## 2016-01-11 LAB — BETA-2-GLYCOPROTEIN I ABS, IGG/M/A
Beta-2 Glyco I IgG: <9 GPI IgG units (ref 0–20)
Beta-2-Glycoprotein I IgA: <9 GPI IgA units (ref 0–25)

## 2016-01-11 LAB — PROTEIN C, TOTAL: Protein C, Total: 119 % (ref 60–150)

## 2016-01-12 LAB — FACTOR 5 LEIDEN

## 2016-01-15 LAB — PROTHROMBIN GENE MUTATION

## 2016-01-25 ENCOUNTER — Emergency Department: Payer: BLUE CROSS/BLUE SHIELD

## 2016-01-25 ENCOUNTER — Emergency Department
Admission: EM | Admit: 2016-01-25 | Discharge: 2016-01-25 | Disposition: A | Discharge status: Home / Self Care | Payer: BLUE CROSS/BLUE SHIELD | Attending: Emergency Medicine | Admitting: Emergency Medicine

## 2016-01-25 DIAGNOSIS — J45909 Unspecified asthma, uncomplicated: Secondary | ICD-10-CM | POA: Insufficient documentation

## 2016-01-25 DIAGNOSIS — M546 Pain in thoracic spine: Secondary | ICD-10-CM | POA: Insufficient documentation

## 2016-01-25 DIAGNOSIS — Z79899 Other long term (current) drug therapy: Secondary | ICD-10-CM | POA: Insufficient documentation

## 2016-01-25 DIAGNOSIS — Z86711 Personal history of pulmonary embolism: Secondary | ICD-10-CM | POA: Insufficient documentation

## 2016-01-25 DIAGNOSIS — M549 Dorsalgia, unspecified: Secondary | ICD-10-CM | POA: Diagnosis present

## 2016-01-25 HISTORY — DX: Other pulmonary embolism without acute cor pulmonale: I26.99

## 2016-01-25 HISTORY — DX: Systemic involvement of connective tissue, unspecified: M35.9

## 2016-01-25 LAB — CBC WITH DIFFERENTIAL/PLATELET
Basophils Absolute: 0.1 10*3/uL (ref 0–0.1)
Basophils Relative: 1 %
EOS ABS: 0.1 10*3/uL (ref 0–0.7)
Eosinophils Relative: 1 %
HEMATOCRIT: 35.2 % (ref 35.0–47.0)
HEMOGLOBIN: 11.6 g/dL — AB (ref 12.0–16.0)
LYMPHS ABS: 2.9 10*3/uL (ref 1.0–3.6)
Lymphocytes Relative: 32 %
MCH: 25.6 pg — AB (ref 26.0–34.0)
MCHC: 33 g/dL (ref 32.0–36.0)
MCV: 77.6 fL — ABNORMAL LOW (ref 80.0–100.0)
MONOS PCT: 6 %
Monocytes Absolute: 0.6 10*3/uL (ref 0.2–0.9)
NEUTROS ABS: 5.4 10*3/uL (ref 1.4–6.5)
NEUTROS PCT: 60 %
Platelets: 189 10*3/uL (ref 150–440)
RBC: 4.54 MIL/uL (ref 3.80–5.20)
RDW: 15.7 % — ABNORMAL HIGH (ref 11.5–14.5)
WBC: 9.1 10*3/uL (ref 3.6–11.0)

## 2016-01-25 LAB — BASIC METABOLIC PANEL
ANION GAP: 6 (ref 5–15)
BUN: 9 mg/dL (ref 6–20)
CHLORIDE: 105 mmol/L (ref 101–111)
CO2: 27 mmol/L (ref 22–32)
Calcium: 9.4 mg/dL (ref 8.9–10.3)
Creatinine, Ser: 0.72 mg/dL (ref 0.44–1.00)
GFR calc Af Amer: >60 mL/min (ref >60)
GLUCOSE: 85 mg/dL (ref 65–99)
POTASSIUM: 3.9 mmol/L (ref 3.5–5.1)
SODIUM: 138 mmol/L (ref 135–145)

## 2016-01-25 LAB — TROPONIN I

## 2016-01-25 LAB — POCT PREGNANCY, URINE: PREG TEST UR: NEGATIVE

## 2016-01-25 MED ORDER — IOPAMIDOL (ISOVUE-370) INJECTION 76%
75.0000 mL | Freq: Once | INTRAVENOUS | Status: AC | PRN
  Administered 2016-01-25: 75 mL via INTRAVENOUS

## 2016-01-25 MED ORDER — CARISOPRODOL 350 MG PO TABS: 350.0000 mg | ORAL_TABLET | Freq: Three times a day (TID) | ORAL | 0 refills | Status: AC | PRN

## 2016-01-25 MED ORDER — SODIUM CHLORIDE 0.9 % IV BOLUS (SEPSIS)
1000.0000 mL | Freq: Once | INTRAVENOUS | Status: AC
  Administered 2016-01-25: 1000 mL via INTRAVENOUS

## 2016-01-25 NOTE — ED Notes (Signed)
See triage note  States she developed pain to mid back last pm  States pain is increased with inspiration only  Not tender on palpation..no fever or trauma

## 2016-01-25 NOTE — ED Provider Notes (Addendum)
[facility] Emergency Department Provider Note   ____________________________________________   First MD Initiated Contact with Patient 01/25/16 [phone]     (approximate)  I have reviewed the triage vital signs and the nursing notes.   HISTORY  Chief Complaint Back Pain   HPI Vanessa Torres is a 45 y.o. female history of pulmonary embolus on eliquis as well as rheumatoid arthritis on methotrexate who is presenting with left sided thoracic back pain. She says that it is a 7 out of 10 and started all of a sudden last night. She says that it worsens with deep breathing. She denies any recent heavy lifting or injury to the area. She says that she has missed one dose for eliquis over the past 6 weeks but otherwise has been compliant. She was diagnosed with a pulmonary embolism when she was traveling in [place]. She was also at that time on  an estrogen containing birth control pill. She has since stopped the birth control pill. She says that she has a brother as well as an uncle who have also had PEs. She has been followed by oncology where she has so far had a negative workup. I was also able to discuss the case with her doctor, Dr. Posey Pronto, who had referred her to the emergency department this morning for likely imaging.she said that initially when she had her pulmonary embolus she also left calf swelling. Says that her left calf swelling has gone down. She denied any shortness of breath initially with her pulmonary embolus considered was more of a chest burning. She says that the pain today in her left thoracic back feels more like a muscle cramp.   Past Medical History:  Diagnosis Date  . Asthma    exercise induced  . Collagen vascular disease ([place])   . Depression    situational  . GERD (gastroesophageal reflux disease)   . Herpes genitalia 1992  . Hyperlipidemia   . Premature ovarian failure 2013   Dr. Cristino Martes, GYN. Symptoms have resolved  . Pulmonary embolism  ([place]) 11/2015  . Rheumatoid arthritis [facility])     Patient Active Problem List   Diagnosis Date Noted  . Elevated rheumatoid factor 06/17/2013  . GERD (gastroesophageal reflux disease) 06/17/2013  . Fatigue 06/17/2013  . Lipoma 06/17/2013  . Breast cancer screening 06/17/2013    Past Surgical History:  Procedure Laterality Date  . APPENDECTOMY  1997  . KNEE SURGERY Bilateral    x2 each knee    Prior to Admission medications   Medication Sig Start Date End Date Taking? Authorizing Provider  predniSONE (STERAPRED UNI-PAK 21 TAB) 10 MG (21) TBPK tablet Take 10 mg by mouth daily.   Yes Historical Provider, MD  acyclovir (ZOVIRAX) 400 MG tablet Take 400 mg by mouth daily.    Historical Provider, MD  albuterol (PROAIR HFA) 108 (90 Base) MCG/ACT inhaler  06/24/15   Historical Provider, MD  albuterol (PROVENTIL HFA;VENTOLIN HFA) 108 (90 Base) MCG/ACT inhaler Inhale 2 puffs into the lungs every 4 (four) hours as needed for wheezing or shortness of breath. Patient not taking: Reported on 01/09/2016 07/06/15   Flora Lipps, MD  Cetirizine HCl 10 MG CAPS Take 1 capsule (10 mg total) by mouth at bedtime. Patient not taking: Reported on 01/09/2016 07/06/15   Flora Lipps, MD  cholecalciferol (VITAMIN D) 1000 UNITS tablet Take 1,000 Units by mouth daily.    Historical Provider, MD  ELIQUIS 5 MG TABS tablet Take 5 mg by mouth 2 (two)  times daily. 12/25/15   Historical Provider, MD  fluticasone furoate-vilanterol (BREO ELLIPTA) 100-25 MCG/INH AEPB Inhale 1 puff into the lungs daily. Patient not taking: Reported on 01/09/2016 07/06/15 07/06/16  Flora Lipps, MD  folic acid (FOLVITE) 1 MG tablet Take by mouth. 11/08/14   Historical Provider, MD  hydroxychloroquine (PLAQUENIL) 200 MG tablet  07/03/15   Historical Provider, MD  methotrexate (RHEUMATREX) 2.5 MG tablet Take by mouth. 11/08/14   Historical Provider, MD  montelukast (SINGULAIR) 10 MG tablet Take by mouth. 07/03/15 07/02/16  Historical Provider, MD  Multiple  Vitamin (MULTIVITAMIN) tablet Take 1 tablet by mouth daily.    Historical Provider, MD  Omega-3 Fatty Acids (FISH OIL) 1200 MG CAPS Take 1 capsule by mouth daily.    Historical Provider, MD    Allergies Review of patient's allergies indicates no known allergies.  Family History  Problem Relation Age of Onset  . Cancer Father     prostate cancer  . Hyperlipidemia Father   . Hypertension Father   . Diabetes Father   . Heart disease Father     Died of HF  . Hyperlipidemia Brother   . Heart disease Maternal Grandmother     CAD - died  . Early death Mother 57    Died of complication from double pna    Social History Social History  Substance Use Topics  . Smoking status: Never Smoker  . Smokeless tobacco: Never Used  . Alcohol use No    Review of Systems Constitutional: No fever/chills Eyes: No visual changes. ENT: No sore throat. Cardiovascular: Denies chest pain. Respiratory: as above Gastrointestinal: No abdominal pain.  No nausea, no vomiting.  No diarrhea.  No constipation. Genitourinary: Negative for dysuria. Musculoskeletal: Negative for back pain. Skin: Negative for rash. Neurological: Negative for headaches, focal weakness or numbness.  10-point ROS otherwise negative.  ____________________________________________   PHYSICAL EXAM:  VITAL SIGNS: ED Triage Vitals  Enc Vitals Group     BP 01/25/16 0758 115/80     Pulse Rate 01/25/16 0758 69     Resp 01/25/16 0758 17     Temp 01/25/16 0758 97.7 F (36.5 C)     Temp Source 01/25/16 0758 Oral     SpO2 01/25/16 0758 98 %     Weight 01/25/16 0759 166 lb (75.3 kg)     Height 01/25/16 0759 6' (1.829 m)     Head Circumference --      Peak Flow --      Pain Score 01/25/16 0759 7     Pain Loc --      Pain Edu? --      Excl. in [place]? --     Constitutional: Alert and oriented. Well appearing and in no acute distress. Eyes: Conjunctivae are normal. PERRL. EOMI. Head: Atraumatic. Nose: No  congestion/rhinnorhea. Mouth/Throat: Mucous membranes are moist.   Neck: No stridor.   Cardiovascular: Normal rate, regular rhythm. Grossly normal heart sounds.   Respiratory: Normal respiratory effort.  No retractions. Lungs CTAB. Gastrointestinal: Soft and nontender. No distention.  Musculoskeletal: No lower extremity tenderness nor edema.  No joint effusions.no tenderness to palpation of the left thoracic region of the back. Neurologic:  Normal speech and language. No gross focal neurologic deficits are appreciated. No gait instability. Skin:  Skin is warm, dry and intact. No rash noted. Psychiatric: Mood and affect are normal. Speech and behavior are normal.  ____________________________________________   LABS (all labs ordered are listed, but only abnormal results are displayed)  Labs Reviewed  CBC WITH DIFFERENTIAL/PLATELET - Abnormal; Notable for the following:       Result Value   Hemoglobin 11.6 (*)    MCV 77.6 (*)    MCH 25.6 (*)    RDW 15.7 (*)    All other components within normal limits  TROPONIN I  BASIC METABOLIC PANEL  POC URINE PREG, ED  POCT PREGNANCY, URINE   ____________________________________________  EKG  ED ECG REPORT I, Reatha Sur,  Youlanda Roys, the attending physician, personally viewed and interpreted this ECG.   Date: 01/25/2016  EKG Time: 913  Rate: 54  Rhythm: sinus bradycardia  Axis: normal  Intervals:none  ST&T Change: no ST segment elevation or depression. No abnormal T-wave inversion.  ____________________________________________  RADIOLOGY  CT Angio Chest PE W and/or Wo Contrast (Accession [id number]) (Order [id number])  Imaging  Date: 01/25/2016 Department: [facility] EMERGENCY DEPARTMENT Released By/Authorizing: Orbie Pyo, MD (auto-released)  PACS Images   Show images for CT Angio Chest PE W and/or Wo Contrast  Study Result   CLINICAL DATA:  Acute thoracic spine pain.  EXAM: CT ANGIOGRAPHY  CHEST WITH CONTRAST  TECHNIQUE: Multidetector CT imaging of the chest was performed using the standard protocol during bolus administration of intravenous contrast. Multiplanar CT image reconstructions and MIPs were obtained to evaluate the vascular anatomy.  CONTRAST:  75 mL of Isovue 370 intravenously.  COMPARISON:  CT scan of May 06, 2013.  FINDINGS: No pneumothorax or pleural effusion is noted. No acute pulmonary disease is noted. There is no evidence of pulmonary embolus. There is no evidence of thoracic aortic dissection or aneurysm. Visualized portion of upper abdomen is unremarkable. No mediastinal mass or adenopathy is noted. No significant osseous abnormality is noted.  Review of the MIP images confirms the above findings.  IMPRESSION: There is no evidence of pulmonary embolus. No acute cardiopulmonary abnormality seen.   Electronically Signed   By: Marijo Conception, M.D.   On: 01/25/2016 11:00    ____________________________________________   PROCEDURES  Procedure(s) performed:   Procedures  Critical Care performed:   ____________________________________________   INITIAL IMPRESSION / ASSESSMENT AND PLAN / ED COURSE  Pertinent labs & imaging results that were available during my care of the patient were reviewed by me and considered in my medical decision making (see chart for details).  ----------------------------------------- 11:38 AM on 01/25/2016 -----------------------------------------  Patient is resting up with this time. Reassuring workup without pulmonary embolus on her CAT scan. Likely muscle pain versus arthritis. I will give her Soma as a muscle relaxer for pain control at home. Patient will be following up with her primary care doctor. We also discuss other methods of pain control as Tylenol or ibuprofen or a salve such as icy hot or BenGay. The patient is understanding of the plan and willing to comply per will be discharged  home.  Clinical Course     ____________________________________________   FINAL CLINICAL IMPRESSION(S) / ED DIAGNOSES  Back pain.    NEW MEDICATIONS STARTED DURING THIS VISIT:  New Prescriptions   No medications on file     Note:  This document was prepared using Dragon voice recognition software and may include unintentional dictation errors.    Orbie Pyo, MD 01/25/16 1138  Also discussed the side effects of sedation from [facility]. The patient understands not to drive for 8 hours after taking this medication or do any other dangerous activity while under the influence of a muscle relaxer.   Randall An  Glenroy Crossen, MD 01/25/16 1140

## 2016-01-25 NOTE — ED Notes (Signed)
AAOx3.  Skin warm and dry.  No SOB/ DOE.  NAD 

## 2016-01-25 NOTE — ED Triage Notes (Signed)
Pt arrives with reports of thoracic back pain that began last pm  Pt reports worse with deep breaths   Recent travel to [place] and while there she was hospitalized with PE    PCP Posey Pronto

## 2016-02-20 ENCOUNTER — Ambulatory Visit: Payer: BLUE CROSS/BLUE SHIELD | Admitting: Family Medicine

## 2016-03-17 DIAGNOSIS — I2699 Other pulmonary embolism without acute cor pulmonale: Secondary | ICD-10-CM | POA: Insufficient documentation

## 2016-03-17 NOTE — Progress Notes (Signed)
[place]  [phone] [phone]  ID: Vanessa Torres OB: 03-25-1971  MR#: [id number]  [id number]  Patient Care Team: Paulina Fusi, MD as PCP - General (Family Medicine)  CHIEF COMPLAINT: Bilateral pulmonary embolism.  INTERVAL HISTORY: Patient returns to clinic today for further evaluation and discussion of her laboratory results. She currently feels well and is back to her baseline. She has no further chest pain or shortness of breath. She has no neurologic complaints. She denies any recent fevers or illnesses. She has good appetite and denies weight loss. She has no nausea, vomiting, constipation, or diarrhea. She has no urinary complaints. Patient offers no specific complaints today.  REVIEW OF SYSTEMS:   Review of Systems  Constitutional: Negative.  Negative for fever, malaise/fatigue and weight loss.  Respiratory: Negative.  Negative for cough, hemoptysis and shortness of breath.   Cardiovascular: Negative.  Negative for chest pain and leg swelling.  Gastrointestinal: Negative.  Negative for abdominal pain.  Genitourinary: Negative.   Musculoskeletal: Negative.   Neurological: Negative.  Negative for weakness.  Endo/Heme/Allergies: Does not bruise/bleed easily.  Psychiatric/Behavioral: Negative.  The patient is not nervous/anxious.    As per HPI. Otherwise, a complete review of systems is negative.   PAST MEDICAL HISTORY: Past Medical History:  Diagnosis Date  . Asthma    exercise induced  . Collagen vascular disease ([place])   . Depression    situational  . GERD (gastroesophageal reflux disease)   . Herpes genitalia 1992  . Hyperlipidemia   . Premature ovarian failure 2013   Dr. Cristino Martes, GYN. Symptoms have resolved  . Pulmonary embolism ([place]) 11/2015  . Rheumatoid arthritis ([place])     PAST SURGICAL HISTORY: Past Surgical History:  Procedure Laterality Date  . APPENDECTOMY  1997  . KNEE SURGERY Bilateral    x2 each knee     FAMILY HISTORY: Family History  Problem Relation Age of Onset  . Cancer Father     prostate cancer  . Hyperlipidemia Father   . Hypertension Father   . Diabetes Father   . Heart disease Father     Died of HF  . Hyperlipidemia Brother   . Heart disease Maternal Grandmother     CAD - died  . Early death Mother 50    Died of complication from double pna       ADVANCED DIRECTIVES (Y/N):  N   HEALTH MAINTENANCE: Social History  Substance Use Topics  . Smoking status: Never Smoker  . Smokeless tobacco: Never Used  . Alcohol use No     Colonoscopy:  PAP:  Bone density:  Lipid panel:  No Known Allergies  Current Outpatient Prescriptions  Medication Sig Dispense Refill  . acyclovir (ZOVIRAX) 400 MG tablet Take 400 mg by mouth daily.    Marland Kitchen albuterol (PROAIR HFA) 108 (90 Base) MCG/ACT inhaler     . carisoprodol (SOMA) 350 MG tablet Take 1 tablet (350 mg total) by mouth 3 (three) times daily as needed for muscle spasms. 15 tablet 0  . cholecalciferol (VITAMIN D) 1000 UNITS tablet Take 1,000 Units by mouth daily.    Marland Kitchen ELIQUIS 5 MG TABS tablet Take 5 mg by mouth 2 (two) times daily.  1  . folic acid (FOLVITE) 1 MG tablet Take by mouth.    . hydroxychloroquine (PLAQUENIL) 200 MG tablet   3  . methotrexate (RHEUMATREX) 2.5 MG tablet Take by mouth.    . montelukast (SINGULAIR) 10 MG tablet Take by mouth.    Marland Kitchen  Multiple Vitamin (MULTIVITAMIN) tablet Take 1 tablet by mouth daily.    . Omega-3 Fatty Acids (FISH OIL) 1200 MG CAPS Take 1 capsule by mouth daily.     No current facility-administered medications for this visit.     OBJECTIVE: Vitals:   03/18/16 1030  BP: 114/74  Pulse: 67  Resp: 18  Temp: 97.4 F (36.3 C)     Body mass index is 22.13 kg/m.    ECOG FS:0 - Asymptomatic  General: Well-developed, well-nourished, no acute distress. Eyes: Pink conjunctiva, anicteric sclera. Lungs: Clear to auscultation bilaterally. Heart: Regular rate and rhythm. No rubs,  murmurs, or gallops. Abdomen: Soft, nontender, nondistended. No organomegaly noted, normoactive bowel sounds. Musculoskeletal: No edema, cyanosis, or clubbing. Neuro: Alert, answering all questions appropriately. Cranial nerves grossly intact. Skin: No rashes or petechiae noted. Psych: Normal affect.   LAB RESULTS:  Lab Results  Component Value Date   NA 138 01/25/2016   K 3.9 01/25/2016   CL 105 01/25/2016   CO2 27 01/25/2016   GLUCOSE 85 01/25/2016   BUN 9 01/25/2016   CREATININE 0.72 01/25/2016   CALCIUM 9.4 01/25/2016   PROT 7.0 05/05/2013   ALBUMIN 3.2 (L) 05/05/2013   AST 18 05/05/2013   ALT 23 05/05/2013   ALKPHOS 44 (L) 05/05/2013   BILITOT 0.4 05/05/2013   GFRNONAA >60 01/25/2016   GFRAA >60 01/25/2016    Lab Results  Component Value Date   WBC 9.1 01/25/2016   NEUTROABS 5.4 01/25/2016   HGB 11.6 (L) 01/25/2016   HCT 35.2 01/25/2016   MCV 77.6 (L) 01/25/2016   PLT 189 01/25/2016     STUDIES: No results found.  ASSESSMENT: Bilateral pulmonary embolism.  PLAN:    1. Bilateral pulmonary embolism: Patient had multiple transient risk factors included extended traveled to [place] as well as on birth control pills.  Full hypercoagulable workup is negative. Patient has been instructed to discontinue Eliquis when she finishes her current prescription. Although her hypercoagulable workup was negative, she is at risk for a second DVT given her history. She does not require long-term anticoagulation but she was educated on avoiding transient risk factors particularly during long travel. If patient ever had a second DVT, would recommend lifelong anticoagulation at that time. No intervention is needed at this time. No follow-up was scheduled. Please refer patient back if there are any questions or concerns.   Patient expressed understanding and was in agreement with this plan. She also understands that She can call clinic at any time with any questions, concerns, or  complaints.    Lloyd Huger, MD   03/18/2016 2:54 PM

## 2016-03-18 ENCOUNTER — Inpatient Hospital Stay: Payer: BLUE CROSS/BLUE SHIELD | Attending: Oncology | Admitting: Oncology

## 2016-03-18 ENCOUNTER — Encounter (INDEPENDENT_AMBULATORY_CARE_PROVIDER_SITE_OTHER): Payer: Self-pay

## 2016-03-18 DIAGNOSIS — I2699 Other pulmonary embolism without acute cor pulmonale: Secondary | ICD-10-CM

## 2016-03-18 DIAGNOSIS — Z79899 Other long term (current) drug therapy: Secondary | ICD-10-CM | POA: Diagnosis not present

## 2016-03-18 DIAGNOSIS — Z86711 Personal history of pulmonary embolism: Secondary | ICD-10-CM

## 2016-03-18 DIAGNOSIS — F329 Major depressive disorder, single episode, unspecified: Secondary | ICD-10-CM | POA: Insufficient documentation

## 2016-03-18 DIAGNOSIS — E785 Hyperlipidemia, unspecified: Secondary | ICD-10-CM | POA: Diagnosis not present

## 2016-03-18 DIAGNOSIS — M129 Arthropathy, unspecified: Secondary | ICD-10-CM | POA: Diagnosis not present

## 2016-03-18 DIAGNOSIS — A6000 Herpesviral infection of urogenital system, unspecified: Secondary | ICD-10-CM | POA: Diagnosis not present

## 2016-03-18 DIAGNOSIS — Z8042 Family history of malignant neoplasm of prostate: Secondary | ICD-10-CM

## 2016-03-18 DIAGNOSIS — Z7901 Long term (current) use of anticoagulants: Secondary | ICD-10-CM | POA: Diagnosis not present

## 2016-03-18 DIAGNOSIS — J45909 Unspecified asthma, uncomplicated: Secondary | ICD-10-CM | POA: Diagnosis not present

## 2016-03-18 DIAGNOSIS — I998 Other disorder of circulatory system: Secondary | ICD-10-CM

## 2016-03-18 DIAGNOSIS — K219 Gastro-esophageal reflux disease without esophagitis: Secondary | ICD-10-CM | POA: Diagnosis not present

## 2016-03-18 NOTE — Progress Notes (Signed)
States is feeling tired today due to busy schedule. Offers no complaints.

## 2016-03-22 ENCOUNTER — Other Ambulatory Visit: Payer: Self-pay | Admitting: Family Medicine

## 2016-03-22 DIAGNOSIS — Z1231 Encounter for screening mammogram for malignant neoplasm of breast: Secondary | ICD-10-CM

## 2016-03-25 ENCOUNTER — Ambulatory Visit
Admission: RE | Admit: 2016-03-25 | Discharge: 2016-03-25 | Disposition: A | Discharge status: Home / Self Care | Payer: BLUE CROSS/BLUE SHIELD | Source: Ambulatory Visit | Attending: Family Medicine | Admitting: Family Medicine

## 2016-03-25 ENCOUNTER — Encounter: Payer: Self-pay | Admitting: Radiology

## 2016-03-25 DIAGNOSIS — Z1231 Encounter for screening mammogram for malignant neoplasm of breast: Secondary | ICD-10-CM | POA: Diagnosis not present

## 2016-03-25 LAB — HM MAMMOGRAPHY

## 2016-03-29 ENCOUNTER — Inpatient Hospital Stay
Admission: RE | Admit: 2016-03-29 | Discharge: 2016-03-29 | Disposition: A | Discharge status: Home / Self Care | Payer: Self-pay | Source: Ambulatory Visit | Attending: *Deleted | Admitting: *Deleted

## 2016-03-29 ENCOUNTER — Other Ambulatory Visit: Payer: Self-pay | Admitting: *Deleted

## 2016-03-29 DIAGNOSIS — Z9289 Personal history of other medical treatment: Secondary | ICD-10-CM

## 2016-04-18 LAB — HM PAP SMEAR: HM PAP: NEGATIVE

## 2016-11-07 DIAGNOSIS — M069 Rheumatoid arthritis, unspecified: Secondary | ICD-10-CM | POA: Insufficient documentation

## 2016-11-27 MED FILL — ENBREL SURCLICK/50MG/mL/PEN: ENBREL SURCLICK/50MG/mL/PEN | 28 days supply | Qty: 1 | Fill #5

## 2016-12-20 MED ORDER — ETANERCEPT 50 MG/ML (1 ML) SUBCUTANEOUS PEN INJECTOR
SUBCUTANEOUS | 5 refills | 0.00000 days | Status: CP
Start: 2016-12-20 — End: 2016-12-20

## 2016-12-20 MED ORDER — ETANERCEPT 50 MG/ML (1 ML) SUBCUTANEOUS PEN INJECTOR: mL | 5 refills | 0 days

## 2016-12-20 NOTE — Unmapped (Signed)
Specialty Pharmacy Refill Coordination Note     Tracie Dixon is a 46 y.o. female contacted today regarding refills of her specialty medication(s).    Reviewed and verified with patient:      Specialty medication(s) and dose(s) confirmed: yes  Changes to medications: no  Changes to insurance: no    Medication Adherence    Medication Assistance Program  Refill Coordination  Has the Patient's Contact Information Changed:  No  Is the Shipping Address Different:  No  Shipping Information  Delivery Scheduled:  Yes  Delivery Date:  12/24/16  Medications to be Shipped:  ENBREL          Follow-up: 3 week(s)     Mitzi Davenport  Specialty Pharmacy Technician

## 2016-12-23 MED FILL — ENBREL SURCLICK/50MG/mL/PEN: ENBREL SURCLICK/50MG/mL/PEN | 28 days supply | Qty: 1 | Fill #0

## 2017-01-14 NOTE — Unmapped (Signed)
Specialty Pharmacy Refill Coordination Note     Tracie Dixon is a 46 y.o. female contacted today regarding refills of her specialty medication(s).    Reviewed and verified with patient:      Specialty medication(s) and dose(s) confirmed: yes  Changes to medications: no  Changes to insurance: no    Medication Adherence    Patient reported X missed doses in the last month:  1  Specialty Medication:  ENBREL  Informant:  patient  Medication Assistance Program  Refill Coordination  Has the Patient's Contact Information Changed:  No  Is the Shipping Address Different:  No  Shipping Information  Delivery Scheduled:  Yes  Delivery Date:  01/17/17  Medications to be Shipped:  ENBREL DELIVER 01/17/17 INJECT 01/26/17           Follow-up: 5 week(s)     [name]  Specialty Pharmacy Technician

## 2017-01-15 MED FILL — ENBREL SURCLICK/50MG/mL/PEN: ENBREL SURCLICK/50MG/mL/PEN | 28 days supply | Qty: 1 | Fill #1

## 2017-01-21 NOTE — Unmapped (Signed)
Specialty Pharmacy - [facility] Rheumatology Telephone Call     Tracie Dixon is a 46 y.o. female contacted today regarding assessment of her specialty medication(s): ENBREL    Reviewed and verified with patient: Allergies - Medications -      Specialty medication(s) and dose(s) confirmed: yes  Changes to medications: no    Tracie Dixon reports tolerating Enbrel well without adverse effects.  Adherence to therapy confirmed with patient and refill record @ [facility].  Patient reports RA well controlled on current therapy.  Appropriate to continue Enbrel at this time.      Does Tracie Dixon have follow up appointment scheduled with clinic? Yes, appointment is scheduled and patient is aware    All questions were answered and contact information provided for any future questions/concerns.      Tracie Dixon

## 2017-01-23 ENCOUNTER — Ambulatory Visit
Admission: RE | Admit: 2017-01-23 | Discharge: 2017-01-23 | Disposition: A | Discharge status: Home / Self Care | Payer: BLUE CROSS/BLUE SHIELD | Source: Ambulatory Visit | Attending: Family Medicine | Admitting: Family Medicine

## 2017-01-23 ENCOUNTER — Other Ambulatory Visit: Payer: Self-pay | Admitting: Family Medicine

## 2017-01-23 DIAGNOSIS — M79662 Pain in left lower leg: Secondary | ICD-10-CM | POA: Diagnosis present

## 2017-01-23 DIAGNOSIS — Z86718 Personal history of other venous thrombosis and embolism: Secondary | ICD-10-CM | POA: Diagnosis present

## 2017-02-17 NOTE — Unmapped (Signed)
Tracie Dixon is a 46 y.o. female contacted today regarding refill of her specialty medication(s): ENBREL     Reviewed and verified with patient:      Specialty medication(s) and dose(s) confirmed: yes  Changes to medications: no  Changes to insurance: no     Medication Adherence    Patient Reported X Missed Doses in the Last Month:  0  Specialty Medication:  ENBREL  Medication Assistance Program  Refill Coordination  Has the Patients' Contact Information Changed:  No    Is the Shipping Address Different:  No    Shipping Information  Delivery Scheduled:  Yes  Delivery Date:  02/20/17  Medications to be Shipped:  ENBREL DELIVERED 02/20/17 INJECT 02/24/17       Next dose of ENBREL from this shipment due on 02/24/17.       All questions were answered and contact information provided for any future questions/concerns.      Tracie Dixon

## 2017-02-19 MED FILL — ENBREL SURCLICK/50MG/mL/PEN: ENBREL SURCLICK/50MG/mL/PEN | 28 days supply | Qty: 1 | Fill #2

## 2017-03-19 NOTE — Unmapped (Signed)
[facility] Specialty Pharmacy Refill Coordination Note  Specialty Medication(s): ENBREL  Additional Medications shipped: NO    Tracie Dixon, DOB: Apr 26, 1971  Phone: [phone] (home) , Alternate phone contact: N/A  Phone or address changes today?: No  All above HIPAA information was verified with patient.  Shipping Address: [address]  [place] [address]   Insurance changes? No    Completed refill call assessment today to schedule patient's medication shipment from the [facility] [phone]).      Confirmed the medication and dosage are correct and have not changed: Yes, regimen is correct and unchanged.    Confirmed patient started or stopped the following medications in the past month:  No, there are no changes reported at this time.    Are you tolerating your medication?:  Tracie Dixon reports tolerating the medication.    ADHERENCE    Did you miss any doses in the past 4 weeks? No missed doses reported.    FINANCIAL/SHIPPING    Delivery Scheduled: Yes, Expected medication delivery date: 03/26/17     Tracie Dixon did not have any additional questions at this time.    Delivery address validated in [facility] scheduling system: Yes, address listed in [facility] is correct.    We will follow up with patient monthly for standard refill processing and delivery.      Thank you,  Tracie Dixon   [facility] Specialty Pharmacist

## 2017-03-25 MED FILL — ENBREL SURCLICK/50MG/mL/PEN: ENBREL SURCLICK/50MG/mL/PEN | 28 days supply | Qty: 1 | Fill #3

## 2017-04-21 NOTE — Unmapped (Signed)
[facility] Specialty Pharmacy Refill Coordination Note  Specialty Medication(s): ENBREL  Additional Medications shipped:     Tracie Dixon, DOB: 07-02-1970  Phone: [phone] (home) , Alternate phone contact: N/A  Phone or address changes today?: No  All above HIPAA information was verified with patient.  Shipping Address: [address]  [place] [address]   Insurance changes? No    Completed refill call assessment today to schedule patient's medication shipment from the [facility] [phone]).      Confirmed the medication and dosage are correct and have not changed: Yes, regimen is correct and unchanged.    Confirmed patient started or stopped the following medications in the past month:  No, there are no changes reported at this time.    Are you tolerating your medication?:  Tracie Dixon reports tolerating the medication.    ADHERENCE    (Below is required for Medicare Part B or Transplant patients only - per drug):   How many tablets were dispensed last month:   Patient currently has  remaining.    Did you miss any doses in the past 4 weeks? No missed doses reported.    FINANCIAL/SHIPPING    Delivery Scheduled: Yes, Expected medication delivery date: 12/6     Tracie Dixon did not have any additional questions at this time.    Delivery address validated in [facility] scheduling system: Yes, address listed in [facility] is correct.    We will follow up with patient monthly for standard refill processing and delivery.      Thank you,  Darlin Coco   [facility] Specialty Technician

## 2017-04-22 ENCOUNTER — Ambulatory Visit
Admission: RE | Admit: 2017-04-22 | Discharge: 2017-04-22 | Disposition: A | Discharge status: Home / Self Care | Payer: BC Managed Care – PPO | Attending: Rheumatology | Admitting: Rheumatology

## 2017-04-22 DIAGNOSIS — M0579 Rheumatoid arthritis with rheumatoid factor of multiple sites without organ or systems involvement: Principal | ICD-10-CM

## 2017-04-22 LAB — CBC W/ AUTO DIFF
EOSINOPHILS ABSOLUTE COUNT: 0.1 10*9/L (ref 0.0–0.4)
HEMATOCRIT: 43.1 % (ref 36.0–46.0)
HEMOGLOBIN: 13.9 g/dL (ref 12.0–16.0)
LARGE UNSTAINED CELLS: 3 % (ref 0–4)
LYMPHOCYTES ABSOLUTE COUNT: 2.4 10*9/L (ref 1.5–5.0)
MEAN CORPUSCULAR HEMOGLOBIN CONC: 32.4 g/dL (ref 31.0–37.0)
MEAN CORPUSCULAR HEMOGLOBIN: 26.2 pg (ref 26.0–34.0)
MEAN CORPUSCULAR VOLUME: 80.9 fL (ref 80.0–100.0)
MEAN PLATELET VOLUME: 7.4 fL (ref 7.0–10.0)
MONOCYTES ABSOLUTE COUNT: 0.4 10*9/L (ref 0.2–0.8)
NEUTROPHILS ABSOLUTE COUNT: 4 10*9/L (ref 2.0–7.5)
PLATELET COUNT: 161 10*9/L (ref 150–440)
RED BLOOD CELL COUNT: 5.33 10*12/L — ABNORMAL HIGH (ref 4.00–5.20)
RED CELL DISTRIBUTION WIDTH: 14.6 % (ref 12.0–15.0)
WBC ADJUSTED: 7.2 10*9/L (ref 4.5–11.0)

## 2017-04-22 LAB — HEMOGLOBIN: Lab: 13.9

## 2017-04-22 MED ORDER — ETANERCEPT 50 MG/ML (1 ML) SUBCUTANEOUS PEN INJECTOR: 50 mg | mL | 5 refills | 0 days | Status: AC

## 2017-04-22 MED ORDER — ETANERCEPT 50 MG/ML (1 ML) SUBCUTANEOUS PEN INJECTOR
SUBCUTANEOUS | 5 refills | 0.00000 days | Status: CP
Start: 2017-04-22 — End: 2017-11-14

## 2017-04-22 NOTE — Unmapped (Addendum)
Diagnoses and all orders for this visit:    Rheumatoid arthritis involving multiple sites with positive rheumatoid factor (CMS-HCC)  -     etanercept (ENBREL) injection 50 mg/mL PEN; Inject 1 mL (50 mg total) under the skin once a week.  -     CBC w/ Differential; Future

## 2017-04-22 NOTE — Unmapped (Signed)
PCP: Dr Reece Packer    HPI: Ms. Offenberger is a 46 year old AA woman with RA diagnosed in 2015.  She had been managed with MTX with an inadequate response.   Etanercept was added in February.   She stopped the MTX soon after that. She has not noted any worsening of her symptoms since stopping the MTX.  Overall, she is doing well.  She feels that her arthritis is well managed on the Enbrel.      She is doing a lot of physical activity.   She started  BURN BOOTCAMP 4 weeks ago.  THis is very strenuous.  SHe is modifying some exercises, mostly involving the knees.  She does not have any morning stiffness.  She denies any injection site reactions.  She has noted some nodules under the skin after each injection.   She takes her shot on Mondays.      She has not had fever, chills, or night sweats. She does have dry eyes which she manages with OTC drops.  She has a mild upper respiratory infection. She has not yet had the flu vaccine since she has been sick.  She continues to note clicking (not pain) in the jaw and decrease in her oral aperture.     PMH:  DVT and PE -- provoked. 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  She previously played in the [facility]  Never smoker  No EtOH  No drugs  Wears seatbelts    ROS: 10 systems are reviewed and are negative except HPI    Examination:  BP 124/65  - Pulse 69  - Temp 35.9 ??C (96.6 ??F) (Oral)  - Resp 14  - Wt 81.6 kg (180 lb)  - BMI 24.41 kg/m??   General: well-appearing and in no distress  HEENT: Head is normocephalic and atraumatic. Pupils equal round and reactive to light.  Oropharynx is clear.  Neck: supple without JVD, adenopathy, or thyromegaly  Lungs: Clear to A& P  Heart: RRR without murmurs, rubs, or gallops  Abdomen: soft and non-tender  Extremities: no clubbing, cyanosis or edema  Neuro: no focal motor or sensory deficits  Skin: no rashes  MSK: Decreased oral aperture. There is normal range of motion of the neck without tenderness. There is full painless range of motion of the shoulders without glenohumeral effusions. The elbows are cool without nodules, synovitis, or limitation in ROM. There is no active synovitis of the small joints of the hands.  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  There is a chronic deformity of the R 4th digit , also traumatic.   The wrists and  are not tender or swollen.  The grip strength is good.  There is good flexion, internal and external rotation of the hips.  The knees are cool without effusion and the ROM is full.  There is normal ROM of the tibiotalar and subtalar joints without swelling.  There is no tenderness or swelling of the MTPs. There is no significant soft tissue tenderness.     IMAGING NOV  2017-reviewed with patient  FEET  Right hallux valgus and metatarsus primus varus with mild lateral subluxation at the right great toe MTP joint and adjacent soft tissue swelling. There is a dorsal medial first metatarsal by dictated. There is also mild hallux valgus and metatarsus primus varus on the left with focal soft tissue swelling adjacent to the left great toe MTP joint. There is a small left dorsal medial first metatarsal bunion. Mild joint space narrowing and marginal osteophytosis of the right great toe MTP joint. Remaining joint spaces are normal. Erosion is of the left fifth metatarsal head. 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 Marginal erosion of the right fifth metacarpal head. There are also question small marginal erosions at the bases of the left second, third, and fifth proximal phalanges. Focal soft tissue swelling over the MCP joints. No osteophytosis or chondrocalcinosis. Overall bone density appears normal.      IMPRESSION ANS PLANS: Ms. 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  She is doing quite well on monotherapy with Etanercept.  There is no evidence of active inflammatory disease on her evaluation today.  She does have some crepitus of the TMJ with decreased excursion of the joint bilaterally. I would like for her to be evaluated by Dr. Lysle [facility]ales [facility] for his thoughts.  She will continue on Etanercept.  We will get a CBC today.  I have encouraged her to get the influenza vaccine as soon as her URI is resolved. She should also have the pneumococcal series.  We can do this at her next visit.     She will followup in 6 months or sooner as needed.   I have discussed my thoughts in detail with Ms. Devilla and all questions were addressed.

## 2017-04-23 MED FILL — ENBREL SURCLICK/50MG/mL/PEN: ENBREL SURCLICK/50MG/mL/PEN | 28 days supply | Qty: 1 | Fill #4

## 2017-04-24 ENCOUNTER — Other Ambulatory Visit: Payer: Self-pay | Admitting: Family Medicine

## 2017-04-24 DIAGNOSIS — Z1231 Encounter for screening mammogram for malignant neoplasm of breast: Secondary | ICD-10-CM

## 2017-05-06 ENCOUNTER — Encounter: Payer: Self-pay | Admitting: Maternal Newborn

## 2017-05-06 ENCOUNTER — Ambulatory Visit
Admission: RE | Admit: 2017-05-06 | Discharge: 2017-05-06 | Disposition: A | Discharge status: Home / Self Care | Payer: BLUE CROSS/BLUE SHIELD | Source: Ambulatory Visit | Attending: Family Medicine | Admitting: Family Medicine

## 2017-05-06 ENCOUNTER — Ambulatory Visit (INDEPENDENT_AMBULATORY_CARE_PROVIDER_SITE_OTHER): Payer: BLUE CROSS/BLUE SHIELD | Admitting: Maternal Newborn

## 2017-05-06 VITALS — BP 100/70 | HR 78 | Ht 72.0 in | Wt 180.0 lb

## 2017-05-06 DIAGNOSIS — Z1231 Encounter for screening mammogram for malignant neoplasm of breast: Secondary | ICD-10-CM | POA: Diagnosis present

## 2017-05-06 DIAGNOSIS — Z124 Encounter for screening for malignant neoplasm of cervix: Secondary | ICD-10-CM | POA: Diagnosis not present

## 2017-05-06 DIAGNOSIS — Z01419 Encounter for gynecological examination (general) (routine) without abnormal findings: Secondary | ICD-10-CM

## 2017-05-06 DIAGNOSIS — Z1211 Encounter for screening for malignant neoplasm of colon: Secondary | ICD-10-CM

## 2017-05-06 NOTE — Progress Notes (Signed)
Gynecology Annual Exam  PCP: Vanessa Fusi, MD  Chief Complaint:  Chief Complaint  Patient presents with  . Gynecologic Exam    History of Present Illness: Patient is a 46 y.o. G2P0020 presents for annual exam. The patient has no complaints today.   LMP: No LMP recorded. Patient is postmenopausal. Menses have been absent for one year and for months. Occasional hot flashes and sleeplessness. Postmenopausal Bleeding: no Postcoital Bleeding: not applicable  The patient is not sexually active. She currently uses none for contraception.  The patient does perform self breast exams.  There is no notable family history of breast or ovarian cancer in her family.  The patient wears seatbelts: yes.   The patient has regular exercise: yes.    The patient denies current symptoms of depression.    Review of Systems: Review of Systems  Constitutional: Negative.   HENT: Negative.   Eyes: Negative.   Respiratory: Negative for shortness of breath and wheezing.   Cardiovascular: Negative for chest pain and palpitations.  Gastrointestinal: Negative for abdominal pain, constipation, diarrhea, heartburn and nausea.  Genitourinary: Negative for dysuria and urgency.  Musculoskeletal: Negative.   Skin: Negative.   Neurological: Negative.   Endo/Heme/Allergies: Negative.   Psychiatric/Behavioral: Negative for depression. The patient is not nervous/anxious.   All other systems reviewed and are negative.   Past Medical History:  Past Medical History:  Diagnosis Date  . Asthma    exercise induced  . Collagen vascular disease ([place])   . Depression    situational  . DVT (deep vein thrombosis) in pregnancy ([place]) 12/2015   left calf  . Fibroid   . GERD (gastroesophageal reflux disease)   . Herpes genitalia 1992  . History of Papanicolaou smear of cervix 12/27/11; 04/18/16   -/-; neg  . Hyperlipidemia   . Ovarian cyst   . Premature ovarian failure 2013   Dr. Cristino Martes, GYN. Symptoms have resolved   . Pulmonary embolism ([place]) 11/2015  . Rheumatoid arthritis [facility])     Past Surgical History:  Past Surgical History:  Procedure Laterality Date  . APPENDECTOMY  1997  . INDUCED ABORTION  05/20/1990; 05/21/1995   x2  . KNEE SURGERY Bilateral    x2 each knee  . LIPOMA EXCISION  11/2015   back  . WISDOM TOOTH EXTRACTION      Gynecologic History:  No LMP recorded. Patient is perimenopausal. Contraception: none Last Pap: 04/18/2016 Results were: NIL and HR HPV negative  Last mammogram: 03/25/2016 Results were: BI-RAD I Obstetric History: G2P0020  Family History:  Family History  Problem Relation Age of Onset  . Cancer Father        prostate cancer  . Hyperlipidemia Father   . Hypertension Father   . Diabetes Father   . Heart disease Father        Died of HF  . Hyperlipidemia Brother   . Heart disease Maternal Grandmother        CAD - died  . Early death Mother 46       Died of complication from double pna  . Cancer Paternal Grandfather        prostate  . Breast cancer Neg Hx     Social History:  Social History   Socioeconomic History  . Marital status: Divorced    Spouse name: Not on file  . Number of children: 0  . Years of education: 68  . Highest education level: Not on file  Social Needs  . Emergency planning/management officer  strain: Not on file  . Food insecurity - worry: Not on file  . Food insecurity - inability: Not on file  . Transportation needs - medical: Not on file  . Transportation needs - non-medical: Not on file  Occupational History  . Occupation: Personnel officer: [employer]  Tobacco Use  . Smoking status: Never Smoker  . Smokeless tobacco: Never Used  Substance and Sexual Activity  . Alcohol use: No  . Drug use: No  . Sexual activity: Not Currently    Birth control/protection: Other-see comments    Comment: perimenopausal  Other Topics Concern  . Not on file  Social History Narrative   Vanessa Torres grew up in [place], [place]. She  attended [employer] and obtained her Bachelor's Degree in Sociology. She also played on the [employer] [phone]. She is currently the [name] at [employer]. She is divorced. Vanessa Torres lives in [place], [place] with her dog, Cece. 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 She also enjoys decorating. 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     Allergies:  Allergies  Allergen Reactions  . Molds & Smuts     Medications: Prior to Admission medications   Medication Sig Start Date End Date Taking? Authorizing Provider  acyclovir (ZOVIRAX) 400 MG tablet Take 400 mg by mouth daily.    [provider]  albuterol (PROAIR HFA) 108 (90 Base) MCG/ACT inhaler  06/24/15   [provider]  cholecalciferol (VITAMIN D) 1000 UNITS tablet Take 1,000 Units by mouth daily.    [provider]  ELIQUIS 5 MG TABS tablet Take 5 mg by mouth 2 (two) times daily. 12/25/15   [provider]  folic acid (FOLVITE) 1 MG tablet Take by mouth. 11/08/14   [provider]  hydroxychloroquine (PLAQUENIL) 200 MG tablet  07/03/15   [provider]  methotrexate (RHEUMATREX) 2.5 MG tablet Take by mouth. 11/08/14   [provider]  montelukast (SINGULAIR) 10 MG tablet Take by mouth. 07/03/15 07/02/16  [provider]  Multiple Vitamin (MULTIVITAMIN) tablet Take 1 tablet by mouth daily.    [provider]  Omega-3 Fatty Acids (FISH OIL) 1200 MG CAPS Take 1 capsule by mouth daily.    [provider]    Physical Exam Vitals: Blood pressure 100/70, pulse 78, height 6' (1.829 m), weight 180 lb (81.6 kg).  General: NAD HEENT: normocephalic, anicteric Thyroid: no enlargement, no palpable nodules Pulmonary: No increased work of breathing, CTAB Cardiovascular: RRR, distal pulses 2+ Breast: Breasts symmetrical, no tenderness, no palpable nodules or masses, no  skin or nipple retraction present, no nipple discharge.  No axillary or supraclavicular lymphadenopathy. Abdomen: NABS, soft, non-tender, non-distended.  Umbilicus without lesions.  No hepatomegaly, splenomegaly or masses palpable. No evidence of hernia  Genitourinary:  External: Normal external female genitalia.  Normal urethral  meatus, normal Bartholin's and Skene's glands.    Vagina: Normal vaginal mucosa, no evidence of prolapse.    Cervix: Grossly normal in appearance, no bleeding  Uterus: Non-enlarged, mobile, normal contour.  No CMT  Adnexa: ovaries non-enlarged, no adnexal masses  Rectal: deferred  Lymphatic: no evidence of inguinal lymphadenopathy Extremities: no edema, erythema, or tenderness Neurologic: Grossly intact Psychiatric: mood appropriate, affect full  Assessment: 46 y.o. G2P0020 here for routine annual exam.  Plan: Problem List Items Addressed This Visit    None    Visit Diagnoses  Encounter for annual routine gynecological examination    -  Primary   Pap smear for cervical cancer screening       Relevant Orders   Pap IG and HPV (high risk) DNA detection   Encounter for screening colonoscopy for non-high-risk patient       Relevant Orders   Ambulatory referral to Gastroenterology      1) Mammogram - recommend yearly screening mammogram.  Mammogram scheduled by PCP.  2) STI screening was offered and declined  3) ASCCP guidelines and rationale discussed.  Patient opts for yearly screening interval  4) Contraception - Patient is postmenopausal.  5) Colonoscopy -- Referral to GI for colonoscopy today.  6) Routine healthcare maintenance including cholesterol, diabetes screening discussed managed by PCP.  7) Menopause - patient does not desire pharmacological interventions for hot flashes and sleeplessness at this time.  Return in about 1 year (around 05/06/2018) for Annual exam.  Avel Sensor, CNM 05/06/2017  9:02 AM

## 2017-05-07 LAB — PAP IG AND HPV HIGH-RISK
HPV, high-risk: NEGATIVE
PAP SMEAR COMMENT: 0

## 2017-05-14 NOTE — Unmapped (Signed)
[facility] Refill Coordination Note    Specialty Program and Medication(s) to be Shipped:   Rheumatology: ENBREL  Other medications to be shipped: N/A     Tracie Dixon, DOB: 1970/08/18  Phone: [phone] (home)   Shipping Address: [address]  [place] [address]  All above HIPAA information was verified with patient.     Completed refill call assessment today to schedule patient's medication shipment from the [facility] [phone]).       Medications reviewed and verified:      Specialty medication(s) and dose(s) confirmed: yes  Changes to medications: no  Changes to insurance: no  Tolerating medications:      DISEASE-SPECIFIC INFORMATION        For Rheumatology patients: Next dose of ENBREL from this shipment due on 05/26/17    ADHERENCE     Medication Adherence    Patient Reported X Missed Doses in the Last Month:  0  Specialty Medication:  ENBREL  Medication Assistance Program  Refill Coordination  Has the Patients' Contact Information Changed:  No    Is the Shipping Address Different:  No    Shipping Information  Delivery Scheduled:  Yes  Delivery Date:  05/16/17  Medications to be Shipped:  ENBREL      (change to the new smartlinks)    (Below is required for Medicare Part B billed medications only - per drug):   Quantity dispensed last month: N/A  Remaining supply on hand: N/A.      SHIPPING     Delivery Scheduled: yes, Expected medication delivery date: 05/16/17      [name]  [facility]

## 2017-05-15 MED FILL — ENBREL SURCLICK/50MG/mL/PEN: ENBREL SURCLICK/50MG/mL/PEN | 28 days supply | Qty: 1 | Fill #5

## 2017-06-16 NOTE — Unmapped (Signed)
[facility] Refill Coordination Note  Specialty Medication(s): Enbrel 50mg /ml        Tracie Dixon, DOB: Oct 11, 1970  Phone: [phone] (home) , Alternate phone contact: N/A  Phone or address changes today?: No  All above HIPAA information was verified with patient.  Shipping Address: [address]  [place] [address]   Insurance changes? No    Completed refill call assessment today to schedule patient's medication shipment from the [facility] [phone]).      Confirmed the medication and dosage are correct and have not changed: Yes, regimen is correct and unchanged.    Confirmed patient started or stopped the following medications in the past month:  No, there are no changes reported at this time.    Are you tolerating your medication?:  Tracie Dixon reports tolerating the medication.  Tracie Dixon reports that the medication doses not seem to be as effective as it has in the past and that she plans to reach out to her doctor to talk about adding methotrexate to her regimen.     ADHERENCE  Did you miss any doses in the past 4 weeks? No missed doses reported.    FINANCIAL/SHIPPING    Delivery Scheduled: Yes, Expected medication delivery date: 06/18/2017     Tracie Dixon did not have any additional questions at this time.    Delivery address validated in [facility] scheduling system: Yes, address listed in [facility] is correct.    We will follow up with patient monthly for standard refill processing and delivery.      Thank you,  Armand Preast  Anders Grant   [facility] Specialty Pharmacist

## 2017-06-17 MED FILL — ENBREL SURCLICK/50MG/mL/PEN: ENBREL SURCLICK/50MG/mL/PEN | 28 days supply | Qty: 1 | Fill #0

## 2017-06-20 ENCOUNTER — Other Ambulatory Visit: Payer: Self-pay | Admitting: Family Medicine

## 2017-06-20 MED ORDER — CYCLOBENZAPRINE HCL 5 MG PO TABS: 5.0000 mg | ORAL_TABLET | Freq: Two times a day (BID) | ORAL | 0 refills | Status: DC | PRN

## 2017-06-29 ENCOUNTER — Other Ambulatory Visit: Payer: Self-pay | Admitting: Family Medicine

## 2017-07-01 ENCOUNTER — Other Ambulatory Visit: Payer: Self-pay

## 2017-07-02 ENCOUNTER — Encounter: Payer: Self-pay | Admitting: Internal Medicine

## 2017-07-02 ENCOUNTER — Ambulatory Visit: Payer: BLUE CROSS/BLUE SHIELD | Admitting: Internal Medicine

## 2017-07-02 VITALS — BP 128/60 | HR 68 | Temp 97.9°F | Ht 72.0 in | Wt 174.6 lb

## 2017-07-02 DIAGNOSIS — Z113 Encounter for screening for infections with a predominantly sexual mode of transmission: Secondary | ICD-10-CM | POA: Diagnosis not present

## 2017-07-02 DIAGNOSIS — M069 Rheumatoid arthritis, unspecified: Secondary | ICD-10-CM | POA: Diagnosis not present

## 2017-07-02 DIAGNOSIS — I671 Cerebral aneurysm, nonruptured: Secondary | ICD-10-CM

## 2017-07-02 DIAGNOSIS — L309 Dermatitis, unspecified: Secondary | ICD-10-CM

## 2017-07-02 DIAGNOSIS — S0300XA Dislocation of jaw, unspecified side, initial encounter: Secondary | ICD-10-CM | POA: Diagnosis not present

## 2017-07-02 DIAGNOSIS — Z13220 Encounter for screening for lipoid disorders: Secondary | ICD-10-CM

## 2017-07-02 DIAGNOSIS — Z1329 Encounter for screening for other suspected endocrine disorder: Secondary | ICD-10-CM | POA: Diagnosis not present

## 2017-07-02 DIAGNOSIS — Z1159 Encounter for screening for other viral diseases: Secondary | ICD-10-CM | POA: Diagnosis not present

## 2017-07-02 DIAGNOSIS — E559 Vitamin D deficiency, unspecified: Secondary | ICD-10-CM | POA: Diagnosis not present

## 2017-07-02 DIAGNOSIS — Z1389 Encounter for screening for other disorder: Secondary | ICD-10-CM

## 2017-07-02 DIAGNOSIS — L308 Other specified dermatitis: Secondary | ICD-10-CM | POA: Diagnosis not present

## 2017-07-02 MED ORDER — HYDROCORTISONE 2.5 % EX OINT
TOPICAL_OINTMENT | Freq: Two times a day (BID) | CUTANEOUS | 0 refills | Status: DC
Start: 2017-07-02 — End: 2017-12-03

## 2017-07-02 MED ORDER — MUPIROCIN 2 % EX OINT: 1.0000 "application " | TOPICAL_OINTMENT | Freq: Two times a day (BID) | CUTANEOUS | 0 refills | Status: DC

## 2017-07-02 NOTE — Patient Instructions (Signed)
I will order brain imaging  Please f/u with Dr. Fredonia Highland about your arthritis  We will try Steroid cream to your ear and also an antibiotic cream 2x per day  Schedule fasting labs  F/u in 2 months sooner if needed   Cerebral Aneurysm An aneurysm is the bulging or ballooning out of part of the weakened wall of a vein or artery. An aneurysm in the vein or artery of the brain is called a brain aneurysm, or cerebral aneurysm. Aneurysms are a risk to your health because they may leak or rupture. Once the aneurysm leaks or ruptures, bleeding occurs. If the bleeding occurs within the brain tissue, the condition is called an intracerebral hemorrhage. An intracerebral hemorrhage can result in a hemorrhagic stroke. If the bleeding occurs in the area between the brain and the thin tissues that cover the brain, the condition is called a subarachnoid hemorrhage. This increases the pressure on the brain and causes some areas of the brain to not get the necessary blood flow. The blood from the ruptured aneurysm collects and presses on the surrounding brain tissue. A subarachnoid hemorrhage can cause a stroke. A ruptured cerebral aneurysm is a medical emergency. This can cause permanent damage and loss of brain function. What are the causes? A cerebral aneurysm is caused when a weakened part of the blood vessel expands. The blood vessel expands due to the constant pressure from the flow of blood through the weakened blood vessel. Usually the aneurysm expands slowly. As the weakened aneurysm expands, the walls of the aneurysm become weaker. Aneurysms may be associated with diseases that weaken and damage the walls of your blood vessels or blood vessels that develop abnormally. Some known causes for cerebral aneurysms are:  Head trauma.  Infection.  Use of "recreational drugs" such as cocaine or amphetamines.  What increases the risk? People at risk for a cerebral aneurysm or hemorrhagic stroke usually have one or  more risk factors, which include:  Having high blood pressure (hypertension).  Abusing alcohol.  Having abnormal blood vessels present since birth.  Having certain bleeding disorders, such as hemophilia, sickle cell disease, or liver disease.  Taking blood thinners (anticoagulants).  Smoking.  Having a family history of aneurysm.  What are the signs or symptoms? The signs and symptoms of an unruptured cerebral aneurysm will partly depend on its size and rate of growth. A small, unchanging aneurysm generally does not produce symptoms. A larger aneurysm that is steadily growing can increase pressure on the brain or nerves. That increased pressure from the unruptured cerebral aneurysm can cause:  A headache.  Problems with your vision.  Numbness or weakness in an arm or leg.  Problems with memory.  Problems speaking.  Seizures.  If an aneurysm leaks or bursts, it can cause a stroke and be life-threatening. Symptoms may include:  A sudden, severe headache with no known cause. The headache is often described as the worst headache ever experienced.  Nausea or vomiting, especially when combined with other symptoms such as a headache.  Sudden weakness or numbness of the face, arm, or leg, especially on one side of the body.  Sudden trouble walking or difficulty moving arms or legs.  Sudden confusion.  Sudden personality changes.  Trouble speaking (aphasia) or understanding.  Difficulty swallowing.  Sudden trouble seeing in one or both eyes.  Double vision.  Dizziness.  Loss of balance or coordination.  Intolerance to light.  Stiff neck.  How is this diagnosed? Your health care provider  may use one of the following tests to diagnose your aneurysm:  Computed tomographic angiography (CTA). This test uses dye and a scanner to produce images of your blood vessels.  Magnetic resonance angiography (MRA). This test uses an MRI machine to produce images of your blood  vessels.  Digital subtraction angiography (DSA). This test uses dye and X-rays to take images of your blood vessels. Your health care provider may use this test to help determine the best course of treatment.  How is this treated? Unruptured Aneurysms Treatment is complex when an aneurysm is found and it is not causing problems. Treatment is very individualized, as each case is different. Many things must be considered, such as the size and exact location of your aneurysm, your age, your overall health, and your feelings and preferences. Small aneurysms in certain locations of the brain have a very low chance of bleeding or rupturing. These small aneurysms may not be treated. However, depending on the size and location of the aneurysm, treatments may be recommended and include:  Coiling. During this procedure, a catheter is inserted and advanced through a blood vessel. Once the catheter reaches the aneurysm, tiny coils are used to block blood flow into the aneurysm.  Surgical clipping. During surgery, a clip is placed at the base of the aneurysm. The clip prevents blood from continuing to enter the aneurysm.  Flow diversion. This procedure is used to divert blood flow around the aneurysm.  Ruptured Aneurysms Immediate emergency surgery may be needed to help prevent damage to the brain and to reduce the risk of rebleeding. Timing of treatment is an important factor in the prevention of complications. Successful early treatment of a ruptured aneurysm (within the first 3 days of a bleed) helps to prevent rebleeding and blood vessel spasm. In some cases, there may be a reason to treat later (10-14 days after a rupture). Many things are considered when making this decision, and each case is handled individually. Follow these instructions at home:  Take medicines only as instructed by your health care provider.  Eat healthy foods. It is recommended that you eat 5 or more servings of fruits and  vegetables each day. Foods may need to be a special consistency (soft or pureed), or small bites may need to be taken if you have had a ruptured aneurysm or stroke. Certain dietary changes may be advised to address high blood pressure, high cholesterol, diabetes, or obesity. ? Food choices that are low in salt (sodium), saturated fat, trans fat, and cholesterol are recommended to manage high blood pressure. ? Food choices that are high in fiber and low in saturated fat, trans fat, and cholesterol are recommended to control cholesterol levels. ? Controlling carbohydrate and sugar intake is recommended to manage diabetes. ? Reducing calorie intake and making food choices that are low in sodium, saturated fat, trans fat, and cholesterol are recommended to manage obesity.  Maintain a healthy weight.  Stay physically active. It is recommended that you get at least 30 minutes of activity on most or all days.  Do not smoke.  Limit alcohol use. Moderate alcohol use is considered to be: ? No more than 2 drinks each day for men. ? No more than 1 drink each day for nonpregnant women.  Stop drug abuse.  A safe home environment is important to reduce the risk of falls. Your health care provider may arrange for specialists to evaluate your home. Having grab bars in the bedroom and bathroom is often important.  Your health care provider may arrange for special equipment to be used at home, such as raised toilets and a seat for the shower.  Physical, occupational, and speech therapy. Ongoing therapy may be needed to maximize your recovery after a ruptured aneurysm or stroke. If you have been advised to use a walker or a cane, use it at all times. Be sure to keep your therapy appointments.  Follow all instructions for follow-up with your health care provider. This is very important. This includes any referrals, physical therapy, rehabilitation, and laboratory tests. Proper follow-up may prevent an aneurysm  rupture or a stroke. Get help right away if:  You have a sudden, severe headache with no known cause.  You have sudden nausea or vomiting with a severe headache.  You have sudden weakness or numbness of the face, arm, or leg, especially on one side of the body.  You have sudden trouble walking or difficulty moving arms or legs.  You have sudden confusion.  You have trouble speaking or understanding.  You have sudden trouble seeing in one or both eyes.  You have a sudden loss of balance or coordination.  You have a stiff neck.  You have difficulty breathing.  You have a partial or total loss of consciousness. Any of these symptoms may represent a serious problem that is an emergency. Do not wait to see if the symptoms will go away. Get medical help at once. Call your local emergency services (911 in [place] Do not drive yourself to the hospital. This information is not intended to replace advice given to you by your health care provider. Make sure you discuss any questions you have with your health care provider. Document Released: 01/26/2002 Document Revised: 10/12/2015 Document Reviewed: 10/22/2012 Elsevier Interactive Patient Education  2017 [employer].

## 2017-07-02 NOTE — Progress Notes (Signed)
Pre visit review using our clinic review tool, if applicable. No additional management support is needed unless otherwise documented below in the visit note. 

## 2017-07-02 NOTE — Progress Notes (Signed)
Chief Complaint  Patient presents with  . Establish Care   New pt establish care  1. C/o itching to left ear and flaking nothing tried  2. H/o allergies to mold and had allergy shots 10+ years ago not on antihistamine now  3. RA worse in hands esp left thumb and feet saw rheuma 04/2017 on Enbrel 50 mg qweek considering MTX f/iu due 10/2017 but pt will call back to sch  4. C/o TMJ sx's has mouth piece with dental  5. Per chart reviewed 2 mm aneurysm near ACA noted in 2004 MRI no f/u since them    Review of Systems  Constitutional: Negative for weight loss.  HENT: Negative for hearing loss.        +TMJ  Eyes: Negative for blurred vision.  Respiratory: Negative for shortness of breath.   Cardiovascular: Negative for chest pain.  Gastrointestinal: Negative for vomiting.  Musculoskeletal: Positive for joint pain.  Skin: Positive for rash.  Neurological: Negative for headaches.  Psychiatric/Behavioral: Negative for memory loss.   Past Medical History:  Diagnosis Date  . Asthma    exercise induced in college   . Brain aneurysm    noted MRI 2004  . Collagen vascular disease ([place])   . Depression    situational  . DVT (deep vein thrombosis) in pregnancy ([place]) 12/2015   left calf  . Fibroid   . GERD (gastroesophageal reflux disease)   . Herpes genitalia 1992  . History of Papanicolaou smear of cervix 12/27/11; 04/18/16   -/-; neg  . HSV-2 (herpes simplex virus 2) infection   . Hyperlipidemia   . Lipoma    left shoulder removed   . Ovarian cyst   . Premature ovarian failure 2013   Dr. Cristino Martes, GYN. Symptoms have resolved  . Pulmonary embolism ([place]) 11/2015   with DVT per pt provoked on international 8 hr flight and was on OCP  . Rheumatoid arthritis ([place])    rheumatoid dx 2015 f/u rheumatology Dr. Rosalia Hammers [facility] worse hands, feet, L thumb  . TMJ (dislocation of temporomandibular joint)    Past Surgical History:  Procedure Laterality Date  . APPENDECTOMY  1997  . INDUCED ABORTION   05/20/1990; 05/21/1995   x2  . KNEE SURGERY Bilateral    x2 each knee  . LIPOMA EXCISION  11/2015   back  . WISDOM TOOTH EXTRACTION     Family History  Problem Relation Age of Onset  . Cancer Father        prostate cancer died 76  . Hyperlipidemia Father   . Hypertension Father   . Diabetes Father   . Heart disease Father        Died of HF  . Hyperlipidemia Brother   . Heart disease Maternal Grandmother        CAD - died  . Early death Mother 98       Died of complication from double pna  . Bronchitis Brother        not smoker   . Cancer Paternal Grandfather        prostate  . Breast cancer Neg Hx    Social History   Socioeconomic History  . Marital status: Divorced    Spouse name: Not on file  . Number of children: 0  . Years of education: 19  . Highest education level: Not on file  Social Needs  . Financial resource strain: Not on file  . Food insecurity - worry: Not on file  .  Food insecurity - inability: Not on file  . Transportation needs - medical: Not on file  . Transportation needs - non-medical: Not on file  Occupational History  . Occupation: Personnel officer: [employer]  Tobacco Use  . Smoking status: Never Smoker  . Smokeless tobacco: Never Used  Substance and Sexual Activity  . Alcohol use: No  . Drug use: No  . Sexual activity: Not Currently    Birth control/protection: Other-see comments    Comment: perimenopausal  Other Topics Concern  . Not on file  Social History Narrative   Vanessa Torres grew up in [place], [place]. She attended [employer] and obtained her Bachelor's Degree in Sociology. She also played on the [employer] [phone]. She is currently the [name] at [employer]. She is divorced. [name] lives in [place], [place] with her dog, Cece. 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 She also enjoys decorating. 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    Played in [employer]    Current Meds  Medication Sig  . etanercept (ENBREL) 50 MG/ML injection Inject 50 mg into the skin.  . valACYclovir (VALTREX) 1000 MG tablet TAKE 1 TABLET BY MOUTH DAILY   Allergies  Allergen Reactions  . Molds & Smuts    Recent Results (from the past 2160 hour(s))  Pap IG and HPV (high risk) DNA detection     Status: None   Collection Time: 05/06/17  9:00 AM  Result Value Ref Range   DIAGNOSIS: Comment     Comment: NEGATIVE FOR INTRAEPITHELIAL LESION AND MALIGNANCY.   Specimen adequacy: Comment     Comment: Satisfactory for evaluation. Endocervical and/or squamous metaplastic cells (endocervical component) are present.    Clinician Provided ICD10 Comment     Comment: Z12.4   Performed by: Comment     Comment: Shanon Brow, Cytotechnologist (ASCP)   PAP Smear Comment .    Note: Comment     Comment: The Pap smear is a screening test designed to aid in the detection of premalignant and malignant conditions of the uterine cervix.  It is not a diagnostic procedure and should not be used as the sole means of detecting cervical cancer.  Both false-positive and false-negative reports do occur.    Test Methodology Comment     Comment: This liquid based ThinPrep(R) pap test was screened with the use of an image guided system.    HPV, high-risk Negative Negative    Comment: This high-risk HPV test detects thirteen high-risk types (16/18/31/33/35/39/45/51/52/56/58/59/68) without differentiation.    Objective  Body mass index is 23.68 kg/m. Wt Readings from Last 3 Encounters:  07/02/17 174 lb 9.6 oz (79.2 kg)  05/06/17 180 lb (81.6 kg)  05/09/16 168 lb (76.2 kg)   Temp Readings from Last 3 Encounters:  07/02/17 97.9 F (36.6 C) (Oral)  03/18/16 97.4 F (36.3 C) (Tympanic)  01/25/16 97.7 F (36.5 C) (Oral)   BP Readings from Last 3 Encounters:  07/02/17 128/60  05/06/17 100/70  05/09/16 90/60   Pulse Readings from  Last 3 Encounters:  07/02/17 68  05/06/17 78  05/09/16 (!) 58   O2 sat room air 98%   Physical Exam  Constitutional: She is oriented to person, place, and time and well-developed, well-nourished, and in no distress.  HENT:  Head: Normocephalic and atraumatic.  Eyes: Conjunctivae are normal. Pupils are equal, round, and reactive to light.  Cardiovascular: Normal rate and normal heart sounds.  Pulmonary/Chest: Effort normal and breath sounds normal.  Abdominal: Soft. Bowel sounds are normal.  Neurological: She is alert and oriented to person, place, and time. Gait normal.  Skin: Skin is warm and dry.  Psychiatric: Mood, memory, affect and judgment normal.  Nursing note and vitals reviewed. left ear with eczematous change with ulceration and hyperpigmentation  Assessment   1. Left ear ? Eczema  2. RA 3. TMJ  4. Brain aneurysm  5. HM Plan  1. Trial bactroban and hc f/u in 2 months 2. Call and sch rheum appt sooner pt interested in MTX reviewed side effects  3. Disc jaw exercises has mouth piece if continues consider PT  4. MRI brain repeat if +f/u neurology  5. Declines flu shot  Will do Tdap at f/u   Check labs CMET, CBC, UA, lipid, TSH, T4, vit D, hep B  Declines std testing  mammo neg 04/2017  Pap neg neg HPV 05/06/17 [facility]     Provider: Dr. Olivia Mackie McLean-Scocuzza-Internal Medicine

## 2017-07-03 ENCOUNTER — Other Ambulatory Visit (INDEPENDENT_AMBULATORY_CARE_PROVIDER_SITE_OTHER): Payer: BLUE CROSS/BLUE SHIELD

## 2017-07-03 DIAGNOSIS — Z13220 Encounter for screening for lipoid disorders: Secondary | ICD-10-CM

## 2017-07-03 DIAGNOSIS — Z1389 Encounter for screening for other disorder: Secondary | ICD-10-CM | POA: Diagnosis not present

## 2017-07-03 DIAGNOSIS — E559 Vitamin D deficiency, unspecified: Secondary | ICD-10-CM | POA: Diagnosis not present

## 2017-07-03 DIAGNOSIS — Z113 Encounter for screening for infections with a predominantly sexual mode of transmission: Secondary | ICD-10-CM | POA: Diagnosis not present

## 2017-07-03 DIAGNOSIS — M069 Rheumatoid arthritis, unspecified: Secondary | ICD-10-CM | POA: Diagnosis not present

## 2017-07-03 DIAGNOSIS — Z1159 Encounter for screening for other viral diseases: Secondary | ICD-10-CM

## 2017-07-03 DIAGNOSIS — Z1329 Encounter for screening for other suspected endocrine disorder: Secondary | ICD-10-CM

## 2017-07-03 LAB — URINALYSIS, ROUTINE W REFLEX MICROSCOPIC
Bilirubin Urine: NEGATIVE
Hgb urine dipstick: NEGATIVE
KETONES UR: NEGATIVE
Leukocytes, UA: NEGATIVE
Nitrite: NEGATIVE
PH: 6 (ref 5.0–8.0)
RBC / HPF: NONE SEEN (ref 0–?)
Total Protein, Urine: NEGATIVE
UROBILINOGEN UA: 0.2 (ref 0.0–1.0)
Urine Glucose: NEGATIVE

## 2017-07-03 LAB — COMPREHENSIVE METABOLIC PANEL
ALBUMIN: 4.1 g/dL (ref 3.5–5.2)
ALK PHOS: 65 U/L (ref 39–117)
ALT: 18 U/L (ref 0–35)
AST: 19 U/L (ref 0–37)
BUN: 13 mg/dL (ref 6–23)
CO2: 29 mEq/L (ref 19–32)
Calcium: 9.7 mg/dL (ref 8.4–10.5)
Chloride: 104 mEq/L (ref 96–112)
Creatinine, Ser: 0.86 mg/dL (ref 0.40–1.20)
GFR: 91.57 mL/min (ref >60.00)
Glucose, Bld: 88 mg/dL (ref 70–99)
POTASSIUM: 4 meq/L (ref 3.5–5.1)
Sodium: 138 mEq/L (ref 135–145)
TOTAL PROTEIN: 6.9 g/dL (ref 6.0–8.3)
Total Bilirubin: 0.6 mg/dL (ref 0.2–1.2)

## 2017-07-03 LAB — LIPID PANEL
CHOL/HDL RATIO: 4
Cholesterol: 223 mg/dL — ABNORMAL HIGH (ref 0–200)
HDL: 52.7 mg/dL (ref >39.00)
LDL CALC: 157 mg/dL — AB (ref 0–99)
NonHDL: 170.65
Triglycerides: 69 mg/dL (ref 0.0–149.0)
VLDL: 13.8 mg/dL (ref 0.0–40.0)

## 2017-07-03 LAB — TSH: TSH: 1.21 u[IU]/mL (ref 0.35–4.50)

## 2017-07-03 LAB — CBC WITH DIFFERENTIAL/PLATELET
Basophils Absolute: 0 10*3/uL (ref 0.0–0.1)
Basophils Relative: 0.4 % (ref 0.0–3.0)
EOS PCT: 2.6 % (ref 0.0–5.0)
Eosinophils Absolute: 0.1 10*3/uL (ref 0.0–0.7)
HEMATOCRIT: 40.7 % (ref 36.0–46.0)
HEMOGLOBIN: 13.3 g/dL (ref 12.0–15.0)
LYMPHS PCT: 37.9 % (ref 12.0–46.0)
Lymphs Abs: 1.7 10*3/uL (ref 0.7–4.0)
MCHC: 32.7 g/dL (ref 30.0–36.0)
MCV: 79.8 fl (ref 78.0–100.0)
MONOS PCT: 8.1 % (ref 3.0–12.0)
Monocytes Absolute: 0.4 10*3/uL (ref 0.1–1.0)
Neutro Abs: 2.3 10*3/uL (ref 1.4–7.7)
Neutrophils Relative %: 51 % (ref 43.0–77.0)
Platelets: 158 10*3/uL (ref 150.0–400.0)
RBC: 5.1 Mil/uL (ref 3.87–5.11)
RDW: 14.8 % (ref 11.5–15.5)
WBC: 4.6 10*3/uL (ref 4.0–10.5)

## 2017-07-03 LAB — T4, FREE: FREE T4: 0.73 ng/dL (ref 0.60–1.60)

## 2017-07-03 LAB — VITAMIN D 25 HYDROXY (VIT D DEFICIENCY, FRACTURES): VITD: 25.97 ng/mL — ABNORMAL LOW (ref 30.00–100.00)

## 2017-07-04 LAB — HEPATITIS B SURFACE ANTIBODY, QUANTITATIVE

## 2017-07-04 LAB — HEPATITIS B SURFACE ANTIGEN: Hepatitis B Surface Ag: NONREACTIVE

## 2017-07-08 ENCOUNTER — Ambulatory Visit: Payer: BLUE CROSS/BLUE SHIELD

## 2017-07-08 ENCOUNTER — Encounter: Payer: Self-pay | Admitting: *Deleted

## 2017-07-09 NOTE — Unmapped (Signed)
[facility] Refill Coordination Note  Specialty Medication(s): ENBREL 50MG /ML  Additional Medications shipped: N/A    Tracie Dixon, DOB: [birth year]  Phone: [phone] (home) , Alternate phone contact: N/A  Phone or address changes today?: No  All above HIPAA information was verified with patient.  Shipping Address: [address]  [place] [address]   Insurance changes? No    Completed refill call assessment today to schedule patient's medication shipment from the [facility] [phone]).      Confirmed the medication and dosage are correct and have not changed: Yes, regimen is correct and unchanged.    Confirmed patient started or stopped the following medications in the past month:  No, there are no changes reported at this time.    Are you tolerating your medication?:  Tracie Dixon reports tolerating the medication.    ADHERENCE    Patient has one pen left. Does her injections on Mondays. Shipping new box to be started on 07/21/17  Did you miss any doses in the past 4 weeks? No missed doses reported.    FINANCIAL/SHIPPING    Delivery Scheduled: Yes, Expected medication delivery date: 07/16/17     The patient will receive an FSI print out for each medication shipped and additional FDA Medication Guides as required.  Patient education from [place] or Robet Leu may also be included in the shipment    [place] did not have any additional questions at this time.    Delivery address validated in FSI scheduling system: Yes, address listed in FSI is correct.    We will follow up with patient monthly for standard refill processing and delivery.      Thank you,  Charna Archer   [facility] Specialty Technician

## 2017-07-10 NOTE — Telephone Encounter (Signed)
Mailed unread mychart message

## 2017-07-14 ENCOUNTER — Ambulatory Visit: Payer: BLUE CROSS/BLUE SHIELD

## 2017-07-14 MED FILL — ENBREL SURCLICK/50MG/mL/PEN: ENBREL SURCLICK/50MG/mL/PEN | 28 days supply | Qty: 1 | Fill #1

## 2017-07-16 ENCOUNTER — Telehealth: Payer: Self-pay

## 2017-07-16 ENCOUNTER — Ambulatory Visit
Admission: RE | Admit: 2017-07-16 | Discharge: 2017-07-16 | Disposition: A | Discharge status: Home / Self Care | Payer: BLUE CROSS/BLUE SHIELD | Source: Ambulatory Visit | Attending: Internal Medicine | Admitting: Internal Medicine

## 2017-07-16 DIAGNOSIS — Z09 Encounter for follow-up examination after completed treatment for conditions other than malignant neoplasm: Secondary | ICD-10-CM | POA: Diagnosis not present

## 2017-07-16 DIAGNOSIS — I671 Cerebral aneurysm, nonruptured: Secondary | ICD-10-CM | POA: Diagnosis present

## 2017-07-16 DIAGNOSIS — Z87898 Personal history of other specified conditions: Secondary | ICD-10-CM | POA: Diagnosis not present

## 2017-07-16 NOTE — Telephone Encounter (Signed)
Results have not been viewed by provider as of yet.

## 2017-07-16 NOTE — Telephone Encounter (Signed)
Copied from [place]. Topic: Inquiry >> Jul 16, 2017 11:09 AM Vanessa Torres wrote: Reason for CRM: Patient called requesting her results form her MRI. Patient wants a call back this morning at [phone].     Thank You!!!

## 2017-07-17 ENCOUNTER — Telehealth: Payer: Self-pay

## 2017-07-17 NOTE — Telephone Encounter (Signed)
Copied from [place]. Topic: Inquiry >> Jul 16, 2017 11:09 AM Pricilla Handler wrote: Reason for CRM: Patient called requesting her results form her MRI. Patient wants a call back this morning at [phone].     Thank You!!! >> Jul 17, 2017  3:37 PM Ahmed Prima L wrote: Patient is calling for an update. Please advise.   [phone]

## 2017-07-17 NOTE — Telephone Encounter (Signed)
Patient notified of results.

## 2017-07-18 ENCOUNTER — Encounter: Payer: Self-pay | Admitting: *Deleted

## 2017-08-06 MED FILL — ENBREL SURCLICK/50MG/mL/PEN: ENBREL SURCLICK/50MG/mL/PEN | 28 days supply | Qty: 1 | Fill #2

## 2017-08-06 NOTE — Unmapped (Signed)
[facility] Refill Coordination Note  Specialty Medication(s): ENBREL  Additional Medications shipped: N/A    Tracie Dixon, DOB: January 25, 1971  Phone: [phone] (home) , Alternate phone contact: N/A  Phone or address changes today?: No  All above HIPAA information was verified with patient.  Shipping Address: [address]  [place] [address]   Insurance changes? No    Completed refill call assessment today to schedule patient's medication shipment from the [facility] [phone]).      Confirmed the medication and dosage are correct and have not changed: Yes, regimen is correct and unchanged.    Confirmed patient started or stopped the following medications in the past month:  No, there are no changes reported at this time.    Are you tolerating your medication?:  [name] reports tolerating the medication.    ADHERENCE    Patient has one pen left for Monday. Sending out new box 3/22 per patient request. Will use new box on 4/1.     Did you miss any doses in the past 4 weeks? No missed doses reported.    FINANCIAL/SHIPPING    Delivery Scheduled: Yes, Expected medication delivery date: 08/08/17     The patient will receive an FSI print out for each medication shipped and additional FDA Medication Guides as required.  Patient education from [place] or Robet Leu may also be included in the shipment    [name] did not have any additional questions at this time.    Delivery address validated in FSI scheduling system: Yes, address listed in FSI is correct.    We will follow up with patient monthly for standard refill processing and delivery.      Thank you,  Renette Butters   [facility] Specialty Technician

## 2017-08-14 NOTE — Unmapped (Signed)
[facility]: Rheumatology Clinic Assessment Call    Specialty Medication(s): Enbrel  Indication(s): RA     Tracie Dixon, DOB: [birth year]  Above HIPAA information was verified with patient.      Medications reviewed & verified: Allergies - Medications -      Specialty medication(s) & dose(s) confirmed: yes  Changes to medications: no  Tolerating medications:   Adverse Effects    *All other systems reviewed and are negative          ADHERENCE     Medication Adherence    Patient Reported X Missed Doses in the Last Month:  0  Specialty Medication:  Enbrel  Patient is on additional specialty medications:  No  Informant:  patient  Provider-Estimated Medication Adherence Level:  good  Medication Assistance Program  Refill Coordination  Shipping Information         CLINICAL ASSESSMENT     Tracie Dixon reports tolerating Enbrel well without adverse effects.  Adherence to therapy confirmed with patient and refill record @ [facility].  Patient reports RA well controlled with the exception of a new pain her in middle left toe.  No swelling around the toe but painful when she has her shoes on.  She would like for Dr. Lorre Munroe to assess to see if this is related to RA or something else.  As therapy is effective, appropriate to continue at this time.      The patient will receive an FSI print out for each medication shipped and additional FDA Medication Guides as required.  Patient education from [place] or Robet Leu may also be included in the shipment    Does Tracie Dixon have follow up appointment scheduled with clinic? Yes but appt is scheduled for June of 2020 - will clarify.     All questions were answered and contact information provided for any future questions/concerns.      Jeneen Montgomery

## 2017-08-27 NOTE — Unmapped (Signed)
Left msg to call back to confirm appt on 10/07/17 at 9am with Dr. Lorre Munroe (per Karie Kirks 08/27/17)

## 2017-09-02 NOTE — Unmapped (Signed)
KFR/MEF working on billing with Enbrel card.      [facility] Refill and Clinical Coordination Note  Medication(s): Enbrel 50 mg/mL    Tracie Dixon, DOB: Nov 29, 1970  Phone: [phone] (home) , Alternate phone contact: N/A  Shipping address: [address]  [place] [address]  Phone or address changes today?: No  All above HIPAA information verified.  Insurance changes? No    Completed refill and clinical call assessment today to schedule patient's medication shipment from the [facility] [phone]).      MEDICATION RECONCILIATION    Confirmed the medication and dosage are correct and have not changed: Yes, regimen is correct and unchanged.    Were there any changes to your medication(s) in the past month:  No, there are no changes reported at this time.    ADHERENCE    Is this medicine transplant or covered by Medicare Part B? No.      Did you miss any doses in the past 4 weeks? No missed doses reported.  Adherence counseling provided? Not needed     SIDE EFFECT MANAGEMENT    Are you tolerating your medication?:  Tracie Dixon reports tolerating the medication.  Side effect management discussed: None      Therapy is appropriate and should be continued.    Evidence of clinical benefit: See Epic note from 04/22/2017      FINANCIAL/SHIPPING    Delivery Scheduled: Yes, Expected medication delivery date: 09/04/2017   Additional medications refilled: No additional medications/refills needed at this time.    The patient will receive an FSI print out for each medication shipped and additional FDA Medication Guides as required.  Patient education from [place] or Robet Leu may also be included in the shipment.    Tracie Dixon did not have any additional questions at this time.    Delivery address validated in FSI scheduling system: Yes, address listed above is correct.      We will follow up with patient monthly for standard refill processing and delivery.      Thank you,    Bea Graff, PharmD Candidate    Thad Ranger   [facility] Specialty Pharmacist

## 2017-09-03 MED FILL — ENBREL SURCLICK/50MG/mL/PEN: ENBREL SURCLICK/50MG/mL/PEN | 28 days supply | Qty: 1 | Fill #3

## 2017-09-08 ENCOUNTER — Ambulatory Visit: Payer: BLUE CROSS/BLUE SHIELD | Admitting: Internal Medicine

## 2017-09-08 DIAGNOSIS — Z2089 Contact with and (suspected) exposure to other communicable diseases: Secondary | ICD-10-CM

## 2017-09-23 NOTE — Unmapped (Signed)
[facility] Refill Coordination Note    Specialty Medication(s) to be Shipped:   Inflammatory Disorders: Enbrel    Other medication(s) to be shipped: n/a     [name], DOB: Apr 23, 1971  Phone: [phone] (home)   Shipping Address: [address]  [place] [address]    All above HIPAA information was verified with patient.     Completed refill call assessment today to schedule patient's medication shipment from the [facility] [phone]).       Specialty medication(s) and dose(s) confirmed: Regimen is correct and unchanged.   Changes to medications: Saraia reports no changes reported at this time.  Changes to insurance: No  Questions for the pharmacist: No    The patient will receive an FSI print out for each medication shipped and additional FDA Medication Guides as required.  Patient education from [place] or Robet Leu may also be included in the shipment.    DISEASE-SPECIFIC INFORMATION        For Rheumatology patients: Next dose of enbrel from this shipment due on 5/20? pt had one pen on hand for this week, based on last encounter looks like she doses on Mondays. said she wasn't at home but thought there was one left     ADHERENCE     Medication Adherence    Patient reported X missed doses in the last month:  0  Specialty Medication:  ENBREL  Patient is on additional specialty medications:  No  Patient is on more than two specialty medications:  No  Any gaps in refill history greater than 2 weeks in the last 3 months:  no  Demonstrates understanding of importance of adherence:  yes  Informant:  patient  Confirmed plan for next specialty medication refill:  delivery by pharmacy  Refills needed for supportive medications:  not needed          Refill Coordination    Has the Patients' Contact Information Changed:  No  Is the Shipping Address Different:  No         SHIPPING     Shipping address confirmed in FSI.     Delivery Scheduled: Yes, Expected medication delivery date: 09/30/17 via UPS or courier.     Renette Butters   [facility] Specialty Technician

## 2017-09-29 MED FILL — ENBREL SURCLICK/50MG/mL/PEN: ENBREL SURCLICK/50MG/mL/PEN | 28 days supply | Qty: 1 | Fill #4

## 2017-10-23 NOTE — Unmapped (Signed)
[facility] Refill Coordination Note  Specialty Medication(s): Enbrel 50mg /ml  Additional Medications shipped: none    Tracie Dixon, DOB: 01/07/1971  Phone: [phone] (home) , Alternate phone contact: N/A  Phone or address changes today?: No  All above HIPAA information was verified with patient.  Shipping Address: [address]  [place] [address]   Insurance changes? No    Completed refill call assessment today to schedule patient's medication shipment from the [facility] [phone]).      Confirmed the medication and dosage are correct and have not changed: Yes, regimen is correct and unchanged.    Confirmed patient started or stopped the following medications in the past month:  No, there are no changes reported at this time.    Are you tolerating your medication?:  Willo reports tolerating the medication.    ADHERENCE    Did you miss any doses in the past 4 weeks? No missed doses reported.    FINANCIAL/SHIPPING    Delivery Scheduled: Yes, Expected medication delivery date: 10/29/17     The patient will receive an FSI print out for each medication shipped and additional FDA Medication Guides as required.  Patient education from [place] or Robet Leu may also be included in the shipment    Maxwell did not have any additional questions at this time.    Delivery address validated in FSI scheduling system: Yes, address listed in FSI is correct.    We will follow up with patient monthly for standard refill processing and delivery.      Thank you,  Lupita Shutter   [facility] Specialty Pharmacist

## 2017-10-28 MED FILL — ENBREL SURCLICK/50MG/mL/PEN: ENBREL SURCLICK/50MG/mL/PEN | 28 days supply | Qty: 1 | Fill #5

## 2017-11-14 MED ORDER — ETANERCEPT 50 MG/ML (1 ML) SUBCUTANEOUS PEN INJECTOR: mL | 11 refills | 0 days | Status: AC

## 2017-11-14 MED ORDER — ETANERCEPT 50 MG/ML (1 ML) SUBCUTANEOUS PEN INJECTOR
SUBCUTANEOUS | 11 refills | 0.00000 days | Status: CP
Start: 2017-11-14 — End: 2017-11-14
  Filled 2018-01-15: qty 4, 28d supply, fill #0

## 2017-11-14 MED ORDER — ENBREL SURECLICK 50 MG/ML (1 ML) SUBCUTANEOUS PEN INJECTOR
PEN_INJECTOR | 3 refills | 0 days | Status: CP
Start: 2017-11-14 — End: 2017-11-14

## 2017-11-14 NOTE — Unmapped (Signed)
Patient informed me she isn't sure if the enbrel is working like it used to.   She said the last few times she did her injection it has felt different  She used to feel a stinging sensation when she injected but she no longer feels that  She's wondering if they changed the formulation.  She's concerned it's not working      Sending msg to [employer] declined delivery until I hear back from [place]    She has 2 pens on hand at this time    Everlean Cherry cpht    Update 7/17- pt has agreed to be seen on 7/30-rescheduling refill call for after appointment

## 2017-11-14 NOTE — Unmapped (Signed)
Refill request

## 2017-11-19 NOTE — Unmapped (Signed)
Received message from [facility] that patient does not think Enbrel is working for her anymore.  She no longer feels stinging sensation with injection as of a few weeks ago and wonder if formulation has changed thus leading to ineffectiveness.  She declined further refills of Enbrel. Reached out to patient for more info and left her a voicemail.     Patient no show to clinic visit on 10/07/17.  Next clinic visit not scheduled until 10/20/2018 with Dr. Lorre Munroe.  Enbrel formulation changed over a year ago with removal of preservative which was thought to help with injection site pain.  However, patient reported that injection felt different a few weeks ago so unlikely related to formulation change.  Best to be seen in clinic and re-evaluate her symptom.      Chelsea Aus

## 2017-11-24 NOTE — Unmapped (Signed)
Called to offer appt for tomorrow with [place] per [place] request on 11/24/17 no answer No option for VM.

## 2017-11-26 NOTE — Unmapped (Signed)
Called to offer appt with Dr. Lorre Munroe per her msg on 11/24/17. No answer Left vm. Dr. Lorre Munroe has agreed to be able to see pt  July 30th at 8:15 am

## 2017-12-01 NOTE — Unmapped (Signed)
Called to offer appt with Dr. Lorre Munroe per her msg on 11/24/17. No answer unable to leave VM due to VM being full. Dr. Lorre Munroe has agreed to be able to see pt July 30th at 8:15 am

## 2017-12-02 ENCOUNTER — Other Ambulatory Visit: Payer: Self-pay | Admitting: Family Medicine

## 2017-12-02 DIAGNOSIS — L290 Pruritus ani: Secondary | ICD-10-CM

## 2017-12-02 NOTE — Unmapped (Signed)
Pt Agreed to be seen on  July 30th at 8:15 am

## 2017-12-03 ENCOUNTER — Ambulatory Visit: Payer: BLUE CROSS/BLUE SHIELD | Admitting: Gastroenterology

## 2017-12-03 ENCOUNTER — Encounter: Payer: Self-pay | Admitting: Gastroenterology

## 2017-12-03 ENCOUNTER — Other Ambulatory Visit: Payer: Self-pay

## 2017-12-03 VITALS — BP 103/68 | HR 78 | Ht 72.0 in | Wt 167.8 lb

## 2017-12-03 DIAGNOSIS — Z1211 Encounter for screening for malignant neoplasm of colon: Secondary | ICD-10-CM | POA: Diagnosis not present

## 2017-12-03 DIAGNOSIS — L290 Pruritus ani: Secondary | ICD-10-CM | POA: Diagnosis not present

## 2017-12-03 DIAGNOSIS — K625 Hemorrhage of anus and rectum: Secondary | ICD-10-CM | POA: Diagnosis not present

## 2017-12-03 DIAGNOSIS — D509 Iron deficiency anemia, unspecified: Secondary | ICD-10-CM

## 2017-12-03 MED ORDER — BISACODYL 5 MG PO TBEC: 10.0000 mg | DELAYED_RELEASE_TABLET | Freq: Once | ORAL | 0 refills | Status: AC

## 2017-12-03 MED ORDER — PEG 3350-KCL-NA BICARB-NACL 420 G PO SOLR: 4000.0000 mL | Freq: Once | ORAL | 0 refills | Status: AC

## 2017-12-03 NOTE — Patient Instructions (Signed)
High-Fiber Diet  Fiber, also called dietary fiber, is a type of carbohydrate found in fruits, vegetables, whole grains, and beans. A high-fiber diet can have many health benefits. Your health care provider may recommend a high-fiber diet to help:  · Prevent constipation. Fiber can make your bowel movements more regular.  · Lower your cholesterol.  · Relieve hemorrhoids, uncomplicated diverticulosis, or irritable bowel syndrome.  · Prevent overeating as part of a weight-loss plan.  · Prevent heart disease, type 2 diabetes, and certain cancers.    What is my plan?  The recommended daily intake of fiber includes:  · 38 grams for men under age 50.  · 30 grams for men over age 50.  · 25 grams for women under age 50.  · 21 grams for women over age 50.    You can get the recommended daily intake of dietary fiber by eating a variety of fruits, vegetables, grains, and beans. Your health care provider may also recommend a fiber supplement if it is not possible to get enough fiber through your diet.  What do I need to know about a high-fiber diet?  · Fiber supplements have not been widely studied for their effectiveness, so it is better to get fiber through food sources.  · Always check the fiber content on the nutrition facts label of any prepackaged food. Look for foods that contain at least 5 grams of fiber per serving.  · Ask your dietitian if you have questions about specific foods that are related to your condition, especially if those foods are not listed in the following section.  · Increase your daily fiber consumption gradually. Increasing your intake of dietary fiber too quickly may cause bloating, cramping, or gas.  · Drink plenty of water. Water helps you to digest fiber.  What foods can I eat?  Grains  Whole-grain breads. Multigrain cereal. Oats and oatmeal. Brown rice. Barley. Bulgur wheat. Millet. Bran muffins. Popcorn. Rye wafer crackers.  Vegetables   Sweet potatoes. Spinach. Kale. Artichokes. Cabbage. Broccoli. Green peas. Carrots. Squash.  Fruits  Berries. Pears. Apples. Oranges. Avocados. Prunes and raisins. Dried figs.  Meats and Other Protein Sources  [place]y, kidney, pinto, and soy beans. Split peas. Lentils. Nuts and seeds.  Dairy  Fiber-fortified yogurt.  Beverages  Fiber-fortified soy milk. Fiber-fortified orange juice.  Other  Fiber bars.  The items listed above may not be a complete list of recommended foods or beverages. Contact your dietitian for more options.  What foods are not recommended?  Grains  White bread. Pasta made with refined flour. White rice.  Vegetables  Fried potatoes. Canned vegetables. Well-cooked vegetables.  Fruits  Fruit juice. Cooked, strained fruit.  Meats and Other Protein Sources  Fatty cuts of meat. Fried poultry or fried fish.  Dairy  Milk. Yogurt. Cream cheese. Sour cream.  Beverages  Soft drinks.  Other  Cakes and pastries. Butter and oils.  The items listed above may not be a complete list of foods and beverages to avoid. Contact your dietitian for more information.  What are some tips for including high-fiber foods in my diet?  · Eat a wide variety of high-fiber foods.  · Make sure that half of all grains consumed each day are whole grains.  · Replace breads and cereals made from refined flour or white flour with whole-grain breads and cereals.  · Replace white rice with brown rice, bulgur wheat, or millet.  · Start the day with a breakfast that is high in fiber,   such as a cereal that contains at least 5 grams of fiber per serving.  · Use beans in place of meat in soups, salads, or pasta.  · Eat high-fiber snacks, such as berries, raw vegetables, nuts, or popcorn.  This information is not intended to replace advice given to you by your health care provider. Make sure you discuss any questions you have with your health care provider.  Document Released: 05/06/2005 Document Revised: 10/12/2015 Document Reviewed: 10/19/2013   Elsevier Interactive Patient Education © 2018 Elsevier Inc.

## 2017-12-03 NOTE — Progress Notes (Signed)
Vanessa Torres [address]  [place]  [place], [place] [address]  Main: [phone]  Fax: [phone]   Gastroenterology Consultation  Referring Provider:     McLean-Scocuzza, Olivia Mackie * Primary Care Physician:  McLean-Scocuzza, Nino Glow, MD Primary Gastroenterologist:  Dr. Vonda Torres Reason for Consultation:     Anal itching        HPI:    Chief Complaint  Patient presents with  . New Patient (Initial Visit)    referral from Dr. Posey Pronto, MD anal itching    Vanessa Torres is a 47 y.o. y/o female referred for consultation & management  by Dr. Terese Door, Nino Glow, MD.  Patient reports 1 year history of intermittent anal itching.  Occurs every 1 to 2 months.  Has had 4-6 episodes of this in the last year.  Does not seem to be straining with these episodes.  Also reports intermittent bright red blood on tissue paper only.  Only 4 episodes in the last year.  Does not strain during this episode.  Denies hard stools or straining with bowel movements.  Has a bowel movement every other day.  No blood streaks in the stool or toilet bowl itself.  No urgency, frequency, weight loss, abdominal pain, nausea, vomiting, heartburn, dysphagia.  No family history of colon cancer.  No prior EGD or colonoscopy.  Past Medical History:  Diagnosis Date  . Asthma    exercise induced in college   . Brain aneurysm    noted MRI 2004  . Collagen vascular disease ([place])   . Depression    situational  . DVT (deep vein thrombosis) in pregnancy ([place]) 12/2015   left calf  . Fibroid   . GERD (gastroesophageal reflux disease)   . Herpes genitalia 1992  . History of Papanicolaou smear of cervix 12/27/11; 04/18/16   -/-; neg  . HSV-2 (herpes simplex virus 2) infection   . Hyperlipidemia   . Lipoma    left shoulder removed   . Ovarian cyst   . Premature ovarian failure 2013   Dr. Cristino Martes, GYN. Symptoms have resolved  . Pulmonary embolism ([place]) 11/2015   with DVT per pt [place] and was on OCP  . Rheumatoid arthritis ([place])    rheumatoid dx 2015 f/u rheumatology Dr. Rosalia Hammers [facility] worse hands, feet, L thumb  . TMJ (dislocation of temporomandibular joint)     Past Surgical History:  Procedure Laterality Date  . APPENDECTOMY  1997  . INDUCED ABORTION  05/20/1990; 05/21/1995   x2  . KNEE SURGERY Bilateral    x2 each knee  . LIPOMA EXCISION  11/2015   back  . WISDOM TOOTH EXTRACTION      Prior to Admission medications   Medication Sig Start Date End Date Taking? Authorizing Provider  etanercept (ENBREL) 50 MG/ML injection Inject 50 mg into the skin. 04/22/17  Yes [provider]  mupirocin ointment (BACTROBAN) 2 % Apply 1 application topically 2 (two) times daily. Left ear bid x 1 week 07/02/17  Yes McLean-Scocuzza, Nino Glow, MD  valACYclovir (VALTREX) 1000 MG tablet TAKE 1 TABLET BY MOUTH DAILY Patient taking differently: TAKE 1 TABLET BY MOUTH DAILY prn outbreak 06/30/17  Yes Paulina Fusi, MD    Family History  Problem Relation Age of Onset  . Cancer Father        prostate cancer died 71  . Hyperlipidemia Father   . Hypertension Father   . Diabetes Father   . Heart  disease Father        Died of HF  . Hyperlipidemia Brother   . Heart disease Maternal Grandmother        CAD - died  . Early death Mother 52       Died of complication from double pna  . Bronchitis Brother        not smoker   . Cancer Paternal Grandfather        prostate  . Breast cancer Neg Hx      Social History   Tobacco Use  . Smoking status: Never Smoker  . Smokeless tobacco: Never Used  Substance Use Topics  . Alcohol use: No  . Drug use: No    Allergies as of 12/03/2017 - Review Complete 12/03/2017  Allergen Reaction Noted  . Molds & smuts  05/05/2017    Review of Systems:    All systems reviewed and negative except where noted in HPI.   Physical Exam:  BP 103/68   Pulse 78   Ht 6' (1.829 m)   Wt 167 lb 12.8 oz (76.1 kg)   LMP  12/22/2015   BMI 22.76 kg/m  Patient's last menstrual period was 12/22/2015. Psych:  Alert and cooperative. Normal mood and affect. General:   Alert,  Well-developed, well-nourished, pleasant and cooperative in NAD Head:  Normocephalic and atraumatic. Eyes:  Sclera clear, no icterus.   Conjunctiva pink. Ears:  Normal auditory acuity. Nose:  No deformity, discharge, or lesions. Mouth:  No deformity or lesions,oropharynx pink & moist. Neck:  Supple; no masses or thyromegaly. Lungs:  Respirations even and unlabored.  Clear throughout to auscultation.   No wheezes, crackles, or rhonchi. No acute distress. Heart:  Regular rate and rhythm; no murmurs, clicks, rubs, or gallops. Abdomen:  Normal bowel sounds.  No bruits.  Soft, non-tender and non-distended without masses, hepatosplenomegaly or hernias noted.  No guarding or rebound tenderness.    Rectal: No perianal lesions, no external hemorrhoids, digital rectal exam did not reveal any masses in the rectal vault. Msk:  Symmetrical without gross deformities. Good, equal movement & strength bilaterally. Pulses:  Normal pulses noted. Extremities:  No clubbing or edema.  No cyanosis. Neurologic:  Alert and oriented x3;  grossly normal neurologically. Skin:  Intact without significant lesions or rashes. No jaundice. Lymph Nodes:  No significant cervical adenopathy. Psych:  Alert and cooperative. Normal mood and affect.   Labs: CBC    Component Value Date/Time   WBC 4.6 07/03/2017 0806   RBC 5.10 07/03/2017 0806   HGB 13.3 07/03/2017 0806   HGB 11.5 (L) 05/05/2013 2009   HCT 40.7 07/03/2017 0806   HCT 35.3 05/05/2013 2009   PLT 158.0 07/03/2017 0806   PLT 192 05/05/2013 2009   MCV 79.8 07/03/2017 0806   MCV 76 (L) 05/05/2013 2009   MCH 25.6 (L) 01/25/2016 0922   MCHC 32.7 07/03/2017 0806   RDW 14.8 07/03/2017 0806   RDW 15.9 (H) 05/05/2013 2009   LYMPHSABS 1.7 07/03/2017 0806   LYMPHSABS 1.9 05/05/2013 2009   MONOABS 0.4 07/03/2017  0806   MONOABS 0.4 05/05/2013 2009   EOSABS 0.1 07/03/2017 0806   EOSABS 0.1 05/05/2013 2009   BASOSABS 0.0 07/03/2017 0806   BASOSABS 0.1 05/05/2013 2009   CMP     Component Value Date/Time   NA 138 07/03/2017 0806   NA 138 05/05/2013 2009   K 4.0 07/03/2017 0806   K 3.7 05/05/2013 2009   CL 104 07/03/2017 0806  CL 103 05/05/2013 2009   CO2 29 07/03/2017 0806   CO2 25 05/05/2013 2009   GLUCOSE 88 07/03/2017 0806   GLUCOSE 105 (H) 05/05/2013 2009   BUN 13 07/03/2017 0806   BUN 13 05/05/2013 2009   CREATININE 0.86 07/03/2017 0806   CREATININE 0.84 05/05/2013 2009   CALCIUM 9.7 07/03/2017 0806   CALCIUM 9.3 05/05/2013 2009   PROT 6.9 07/03/2017 0806   PROT 7.0 05/05/2013 2009   ALBUMIN 4.1 07/03/2017 0806   ALBUMIN 3.2 (L) 05/05/2013 2009   AST 19 07/03/2017 0806   AST 18 05/05/2013 2009   ALT 18 07/03/2017 0806   ALT 23 05/05/2013 2009   ALKPHOS 65 07/03/2017 0806   ALKPHOS 44 (L) 05/05/2013 2009   BILITOT 0.6 07/03/2017 0806   BILITOT 0.4 05/05/2013 2009   GFRNONAA >60 01/25/2016 0922   GFRNONAA >60 05/05/2013 2009   GFRAA >60 01/25/2016 0922   GFRAA >60 05/05/2013 2009    Imaging Studies: No results found.  Assessment and Plan:   Vanessa Torres is a 47 y.o. y/o female has been referred for intermittent anal itching, and intermittent bright red blood per rectum  Symptoms most consistent with hemorrhoids, likely internal High-fiber diet MiraLAX or Metamucil daily with goal of 1-2 soft bowel movements daily.  If not at goal, patient instructed to increase dose to twice daily.  If loose stools with the medication, patient asked to decrease the medication to every other day, or half dose daily.  Patient verbalized understanding  Patient is due for screening colonoscopy, as per American Cancer Society guidelines recommending screening colonoscopy starting at 46 years of age This was discussed with her, and she is agreeable with scheduling It will also allow [place] to  evaluate for any underlying lesions, internal hemorrhoids and rule out malignancy  I will also check ferritin, and iron levels as her hemoglobin is normal, but MCV is low normal. If blood work shows iron deficiency, we will also schedule EGD with colonoscopy   I have discussed alternative options, risks & benefits,  which include, but are not limited to, bleeding, infection, perforation,respiratory complication & drug reaction.  The patient agrees with this plan & written consent will be obtained.    Dr Vanessa Torres

## 2017-12-04 ENCOUNTER — Other Ambulatory Visit: Payer: Self-pay | Admitting: Family Medicine

## 2017-12-04 ENCOUNTER — Other Ambulatory Visit: Payer: BLUE CROSS/BLUE SHIELD

## 2017-12-04 DIAGNOSIS — D649 Anemia, unspecified: Secondary | ICD-10-CM

## 2017-12-04 DIAGNOSIS — E559 Vitamin D deficiency, unspecified: Secondary | ICD-10-CM

## 2017-12-05 LAB — IRON AND TIBC
Iron Saturation: 18 % (ref 15–55)
Iron: 70 ug/dL (ref 27–159)
Total Iron Binding Capacity: 380 ug/dL (ref 250–450)
UIBC: 310 ug/dL (ref 131–425)

## 2017-12-05 LAB — FERRITIN: FERRITIN: 31 ng/mL (ref 15–150)

## 2017-12-05 LAB — VITAMIN D 25 HYDROXY (VIT D DEFICIENCY, FRACTURES)
Vit D, 25-Hydroxy: 35.8 ng/mL (ref 30.0–100.0)
Vit D, 25-Hydroxy: 27.3 ng/mL — ABNORMAL LOW (ref 30.0–100.0)

## 2017-12-15 ENCOUNTER — Telehealth: Payer: Self-pay

## 2017-12-15 DIAGNOSIS — D509 Iron deficiency anemia, unspecified: Secondary | ICD-10-CM

## 2017-12-15 NOTE — Telephone Encounter (Signed)
-----   Message from Virgel Manifold, MD sent at 12/08/2017 12:47 PM EDT ----- Vanessa Torres please let patient know, her lab work showed her iron work-up to be normal.  However, since her MCV was noted to be low on her last labs, refer her to hematology for microcytic anemia to evaluate the cause of it.  Proceed with colonoscopy as planned

## 2017-12-15 NOTE — Telephone Encounter (Signed)
Pt notified of MCV low and needs a referral to hematology for Microcytic Anemia. Pt aware they will be contacted her.

## 2017-12-16 ENCOUNTER — Ambulatory Visit
Admit: 2017-12-16 | Discharge: 2017-12-16 | Payer: PRIVATE HEALTH INSURANCE | Attending: Rheumatology | Primary: Rheumatology

## 2017-12-16 ENCOUNTER — Encounter (INDEPENDENT_AMBULATORY_CARE_PROVIDER_SITE_OTHER): Payer: Self-pay

## 2017-12-16 ENCOUNTER — Telehealth: Payer: Self-pay | Admitting: Oncology

## 2017-12-16 DIAGNOSIS — M0579 Rheumatoid arthritis with rheumatoid factor of multiple sites without organ or systems involvement: Principal | ICD-10-CM

## 2017-12-16 LAB — CBC W/ AUTO DIFF
BASOPHILS ABSOLUTE COUNT: 0 10*9/L (ref 0.0–0.1)
BASOPHILS RELATIVE PERCENT: 0.2 %
EOSINOPHILS RELATIVE PERCENT: 2.4 %
HEMOGLOBIN: 12.7 g/dL (ref 12.0–16.0)
LARGE UNSTAINED CELLS: 4 % (ref 0–4)
LYMPHOCYTES ABSOLUTE COUNT: 1.8 10*9/L (ref 1.5–5.0)
LYMPHOCYTES RELATIVE PERCENT: 42.5 %
MEAN CORPUSCULAR HEMOGLOBIN CONC: 31.4 g/dL (ref 31.0–37.0)
MEAN CORPUSCULAR HEMOGLOBIN: 25.6 pg — ABNORMAL LOW (ref 26.0–34.0)
MEAN CORPUSCULAR VOLUME: 81.5 fL (ref 80.0–100.0)
MEAN PLATELET VOLUME: 7.8 fL (ref 7.0–10.0)
MONOCYTES ABSOLUTE COUNT: 0.3 10*9/L (ref 0.2–0.8)
MONOCYTES RELATIVE PERCENT: 7.3 %
NEUTROPHILS ABSOLUTE COUNT: 1.8 10*9/L — ABNORMAL LOW (ref 2.0–7.5)
NEUTROPHILS RELATIVE PERCENT: 44 %
PLATELET COUNT: 155 10*9/L (ref 150–440)
RED BLOOD CELL COUNT: 4.97 10*12/L (ref 4.00–5.20)
RED CELL DISTRIBUTION WIDTH: 14.1 % (ref 12.0–15.0)
WBC ADJUSTED: 4.2 10*9/L — ABNORMAL LOW (ref 4.5–11.0)

## 2017-12-16 LAB — ALT (SGPT): Alanine aminotransferase:CCnc:Pt:Ser/Plas:Qn:: 17

## 2017-12-16 LAB — CREATININE: EGFR CKD-EPI NON-AA FEMALE: >90 mL/min/{1.73_m2} (ref >=60)

## 2017-12-16 LAB — EGFR CKD-EPI AA FEMALE: Lab: >90

## 2017-12-16 LAB — C-REACTIVE PROTEIN: C reactive protein:MCnc:Pt:Ser/Plas:Qn:: <5

## 2017-12-16 LAB — LYMPHOCYTES ABSOLUTE COUNT: Lab: 1.8

## 2017-12-16 LAB — AST (SGOT): Aspartate aminotransferase:CCnc:Pt:Ser/Plas:Qn:: 28

## 2017-12-16 MED ORDER — METHOTREXATE SODIUM 2.5 MG TABLET
ORAL_TABLET | ORAL | 1 refills | 0 days | Status: CP
Start: 2017-12-16 — End: 2018-06-01

## 2017-12-16 MED ORDER — FOLIC ACID 1 MG TABLET
ORAL_TABLET | Freq: Every day | ORAL | 3 refills | 0 days | Status: CP
Start: 2017-12-16 — End: 2018-07-23

## 2017-12-16 NOTE — Telephone Encounter (Signed)
Called pt to re-establish, but was unable to leave a message due to VM being full. Received referral from [facility] to bring pt back in for Anemia. Mailed pt a new appt and will attempt to contact her again this afternoon.

## 2017-12-16 NOTE — Unmapped (Signed)
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  -     folic acid (FOLVITE) 1 MG tablet; Take 1 tablet (1 mg total) by mouth daily.

## 2017-12-16 NOTE — Unmapped (Signed)
[facility] Refill Coordination Note  Specialty Medication(s): Enbrel  Additional Medications shipped: n/a    Tracie Dixon, DOB: [birth year]  Phone: [phone] (home) , Alternate phone contact: N/A  Phone or address changes today?: No  All above HIPAA information was verified with patient.  Shipping Address: [address]  [place] [address]   Insurance changes? No    Completed refill call assessment today to schedule patient's medication shipment from the [facility] [phone]).      Confirmed the medication and dosage are correct and have not changed: Yes, regimen is correct and unchanged.    Confirmed patient started or stopped the following medications in the past month:  No, there are no changes reported at this time.    Are you tolerating your medication?:  Leahanna reports tolerating the medication.    ADHERENCE    Is this medicine transplant or covered by Medicare Part B?         Did you miss any doses in the past 4 weeks? No missed doses reported.    FINANCIAL/SHIPPING    Delivery Scheduled: Yes, Expected medication delivery date: 12/18/17     The patient will receive an FSI print out for each medication shipped and additional FDA Medication Guides as required.  Patient education from [place] or Robet Leu may also be included in the shipment.    Jessalynn did not have any additional questions at this time.    Delivery address validated in FSI scheduling system: Yes, address listed in FSI is correct.    We will follow up with patient monthly for standard refill processing and delivery.      Thank you,  Swayzee Wadley Vangie Bicker   [facility] Specialty Pharmacist

## 2017-12-16 NOTE — Unmapped (Signed)
PCP: Dr Reece Packer    HPI: Tracie Dixon is a 47 year old AA woman with RA diagnosed in 2015.  She had been managed with MTX with an inadequate response.   Etanercept was added in February 2018.   She stopped the MTX soon after that. At her last visit in Dec 2018 she was doing well on Etanercept monotherapy.       Today she reports more pain in her feet, elbows and anterior chest.  This has been ongoing for months.  The feet started months ago and this week the elbows started.  Her feet hurt in the AM and it does not really get better as she moves around.  She continues to exercise, but not as much as she would like.  She walks on the treadmill.     She is scheduled for a colonoscopy at [facility] for some anal symptoms with some blood.  She has some bloodwork     No fever, chills, or night sweats. She has fatigue but is sleeping well.       PMH:  DVT and PE -- provoked. Now off anticoagulation  RA  Diagnosed 2015    MEDS:  Etanercept 50 mg weekly    Allergies  NONE KNOWN    Immunizations:  Immunization History   Administered Date(s) Administered   ??? 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  She previously played in the [employer]  Never smoker  No EtOH  No drugs  Wears seatbelts    ROS: 10 systems are reviewed and are negative except HPI    Examination:    General: well-appearing and in no distress  HEENT: Head is normocephalic and atraumatic. Pupils equal round and reactive to light.  Oropharynx is clear.  Neck: supple without JVD, adenopathy, or thyromegaly  Lungs: Clear to A& P  Heart: RRR without murmurs, rubs, or gallops  Abdomen: soft and non-tender  Extremities: no clubbing, cyanosis or edema  Neuro: no focal motor or sensory deficits  Skin: no rashes  MSK: Oral aperture is normal.  There is normal range of motion of the neck without tenderness. There is full painless range of motion of the shoulders without glenohumeral effusions. There is some tenderness without swelling of the costochondral junctions on the left chest. The elbows are cool without nodules, synovitis, or limitation in ROM. There is no active synovitis of the small joints of the hands.  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  There is a chronic deformity of the R 4th digit , also traumatic.   The wrists and  are not tender or swollen.  The grip strength is good.  There is good flexion, internal and external rotation of the hips.  The knees are cool without effusion and the ROM is full.  There is normal ROM of the R tibiotalar and subtalar joints. The L ankle is slightly puffy and there is some decrease ROM of the tibiotalar and subtalar joints.  There is no tenderness or swelling of the MTPs. There is no significant soft tissue tenderness.     IMAGING NOV 2017  FEET  Right hallux valgus and metatarsus  primus varus with mild lateral subluxation at the right great toe MTP joint and adjacent soft tissue swelling. There is a dorsal medial first metatarsal by dictated. There is also mild hallux valgus and metatarsus primus varus on the left with focal soft tissue swelling adjacent to the left great toe MTP joint. There is a small left dorsal medial first metatarsal bunion. Mild joint space narrowing and marginal osteophytosis of the right great toe MTP joint. Remaining joint spaces are normal. Erosion is of the left fifth metatarsal head. 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 Marginal erosion of the right fifth metacarpal head. There are also question small marginal erosions at the bases of the left second, third, and fifth proximal phalanges. Focal soft tissue swelling over the MCP joints. No osteophytosis or chondrocalcinosis. Overall bone density appears normal.    Office Visit on 12/16/2017   Component Date Value   ??? CRP 12/16/2017 <5.0    ??? AST 12/16/2017 28    ??? ALT 12/16/2017 17    ??? Creatinine 12/16/2017 0.78    ??? EGFR CKD-EPI Non-African* 12/16/2017 >90    ??? EGFR CKD-EPI African Ame* 12/16/2017 >90    ??? WBC 12/16/2017 4.2*   ??? RBC 12/16/2017 4.97    ??? HGB 12/16/2017 12.7    ??? HCT 12/16/2017 40.5    ??? MCV 12/16/2017 81.5    ??? [facility] 12/16/2017 25.6*   ??? MCHC 12/16/2017 31.4    ??? RDW 12/16/2017 14.1    ??? MPV 12/16/2017 7.8    ??? Platelet 12/16/2017 155    ??? Neutrophils % 12/16/2017 44.0    ??? Lymphocytes % 12/16/2017 42.5    ??? Monocytes % 12/16/2017 7.3    ??? Eosinophils % 12/16/2017 2.4    ??? Basophils % 12/16/2017 0.2    ??? Absolute Neutrophils 12/16/2017 1.8*   ??? Absolute Lymphocytes 12/16/2017 1.8    ??? Absolute Monocytes 12/16/2017 0.3    ??? Absolute Eosinophils 12/16/2017 0.1    ??? Absolute Basophils 12/16/2017 0.0    ??? Large Unstained Cells 12/16/2017 4    ??? Hypochromasia 12/16/2017 Moderate*   ]      IMPRESSION ANS PLANS: Tracie Dixon is a 47 year old woman with seropositive and erosive RA diagnosed in 2015.  She is having increased disease activity on monotherapy with Etanercept.  I suspect that she will require combination therapy with MTX going forward and we have discussed that in detail today.  She tolerated that well and is fine going back on.     1. Continue Etanercept 50 mg weekly  2. Add back MTX 20 mg weekly with folic acide 1 mg daily  3. Labs updated and reviewed above.  No concerns.   4. Prevnar today and PPSV 23 at her next visit  5. Follow up in 3 months or sooner as needed.

## 2017-12-17 MED FILL — ENBREL SURCLICK/50MG/mL/PEN: ENBREL SURCLICK/50MG/mL/PEN | 28 days supply | Qty: 1 | Fill #0

## 2017-12-23 ENCOUNTER — Telehealth: Payer: Self-pay

## 2017-12-23 NOTE — Telephone Encounter (Signed)
Pt wanted to know about the dulcolax tab for her day before prep. Did she need to split dose or was this for the golytely? I informed pt that was for the golytely which she starts at 5pm. The dulcolax she takes 2 tab (5mg ) to get the dose of 10mg .  Pt understood.

## 2017-12-24 ENCOUNTER — Encounter
Admission: RE | Disposition: A | Discharge status: Home / Self Care | Payer: Self-pay | Source: Ambulatory Visit | Attending: Gastroenterology

## 2017-12-24 ENCOUNTER — Ambulatory Visit
Admission: RE | Admit: 2017-12-24 | Discharge: 2017-12-24 | Disposition: A | Discharge status: Home / Self Care | Payer: BLUE CROSS/BLUE SHIELD | Source: Ambulatory Visit | Attending: Gastroenterology | Admitting: Gastroenterology

## 2017-12-24 ENCOUNTER — Ambulatory Visit: Payer: BLUE CROSS/BLUE SHIELD | Admitting: Anesthesiology

## 2017-12-24 ENCOUNTER — Encounter: Payer: Self-pay | Admitting: Gastroenterology

## 2017-12-24 DIAGNOSIS — D122 Benign neoplasm of ascending colon: Secondary | ICD-10-CM

## 2017-12-24 DIAGNOSIS — Z86711 Personal history of pulmonary embolism: Secondary | ICD-10-CM | POA: Diagnosis not present

## 2017-12-24 DIAGNOSIS — M069 Rheumatoid arthritis, unspecified: Secondary | ICD-10-CM | POA: Insufficient documentation

## 2017-12-24 DIAGNOSIS — Z91048 Other nonmedicinal substance allergy status: Secondary | ICD-10-CM | POA: Diagnosis not present

## 2017-12-24 DIAGNOSIS — Z1211 Encounter for screening for malignant neoplasm of colon: Secondary | ICD-10-CM

## 2017-12-24 DIAGNOSIS — Z8249 Family history of ischemic heart disease and other diseases of the circulatory system: Secondary | ICD-10-CM | POA: Diagnosis not present

## 2017-12-24 HISTORY — PX: COLONOSCOPY WITH PROPOFOL: SHX5780

## 2017-12-24 SURGERY — COLONOSCOPY WITH PROPOFOL
Anesthesia: General

## 2017-12-24 MED ORDER — MIDAZOLAM HCL 5 MG/5ML IJ SOLN
INTRAMUSCULAR | Status: DC | PRN
  Administered 2017-12-24: 1 mg via INTRAVENOUS

## 2017-12-24 MED ORDER — EPHEDRINE SULFATE 50 MG/ML IJ SOLN
INTRAMUSCULAR | Status: DC | PRN
  Administered 2017-12-24: 5 mg via INTRAVENOUS

## 2017-12-24 MED ORDER — LIDOCAINE HCL (CARDIAC) PF 100 MG/5ML IV SOSY
PREFILLED_SYRINGE | INTRAVENOUS | Status: DC | PRN
  Administered 2017-12-24: 60 mg via INTRAVENOUS

## 2017-12-24 MED ORDER — MIDAZOLAM HCL 2 MG/2ML IJ SOLN
INTRAMUSCULAR | Status: AC
  Filled 2017-12-24: qty 2

## 2017-12-24 MED ORDER — SODIUM CHLORIDE 0.9 % IV SOLN
INTRAVENOUS | Status: DC
  Administered 2017-12-24: 1000 mL via INTRAVENOUS

## 2017-12-24 MED ORDER — PROPOFOL 500 MG/50ML IV EMUL
INTRAVENOUS | Status: DC | PRN
  Administered 2017-12-24: 140 ug/kg/min via INTRAVENOUS

## 2017-12-24 MED ORDER — SODIUM CHLORIDE 0.9 % IV SOLN: INTRAVENOUS | Status: DC

## 2017-12-24 MED ORDER — PROPOFOL 10 MG/ML IV BOLUS
INTRAVENOUS | Status: DC | PRN
  Administered 2017-12-24: 70 mg via INTRAVENOUS
  Administered 2017-12-24: 10 mg via INTRAVENOUS
  Administered 2017-12-24: 30 mg via INTRAVENOUS

## 2017-12-24 MED ORDER — EPHEDRINE SULFATE 50 MG/ML IJ SOLN
INTRAMUSCULAR | Status: AC
  Filled 2017-12-24: qty 1

## 2017-12-24 NOTE — Anesthesia Preprocedure Evaluation (Signed)
Anesthesia Evaluation  Patient identified by MRN, date of birth, ID band Patient awake    Reviewed: Allergy & Precautions, H&P , NPO status , reviewed documented beta blocker date and time   Airway Mallampati: II  TM Distance: >3 FB Neck ROM: full    Dental  (+) Teeth Intact   Pulmonary asthma ,    Pulmonary exam normal        Cardiovascular + Peripheral Vascular Disease  Normal cardiovascular exam     Neuro/Psych PSYCHIATRIC DISORDERS Depression    GI/Hepatic GERD  Controlled,  Endo/Other    Renal/GU      Musculoskeletal  (+) Arthritis ,   Abdominal   Peds  Hematology   Anesthesia Other Findings Past Medical History: No date: Asthma     Comment:  exercise induced in college  No date: Brain aneurysm     Comment:  noted MRI 2004 No date: Collagen vascular disease ([place]) No date: Depression     Comment:  situational 12/2015: DVT (deep vein thrombosis) in pregnancy ([place])     Comment:  left calf No date: Fibroid No date: GERD (gastroesophageal reflux disease) 1992: Herpes genitalia 12/27/11; 04/18/16: History of Papanicolaou smear of cervix     Comment:  -/-; neg No date: HSV-2 (herpes simplex virus 2) infection No date: Hyperlipidemia No date: Lipoma     Comment:  left shoulder removed  No date: Ovarian cyst 2013: Premature ovarian failure     Comment:  Dr. Cristino Martes, GYN. Symptoms have resolved 11/2015: Pulmonary embolism ([place])     Comment:  with DVT per pt provoked on international 8 hr flight               and was on OCP No date: Rheumatoid arthritis ([place])     Comment:  rheumatoid dx 2015 f/u rheumatology Dr. Rosalia Hammers [facility]               worse hands, feet, L thumb No date: TMJ (dislocation of temporomandibular joint)  Past Surgical History: 1997: APPENDECTOMY 05/20/1990; 05/21/1995: INDUCED ABORTION     Comment:  x2 No date: KNEE SURGERY; Bilateral     Comment:  x2 each knee 11/2015: LIPOMA EXCISION    Comment:  back No date: WISDOM TOOTH EXTRACTION  BMI    Body Mass Index:  23.19 kg/m      Reproductive/Obstetrics                             Anesthesia Physical Anesthesia Plan  ASA: II  Anesthesia Plan: General   Post-op Pain Management:    Induction: Intravenous  PONV Risk Score and Plan: 3 and Treatment may vary due to age or medical condition and TIVA  Airway Management Planned: Nasal Cannula and Natural Airway  Additional Equipment:   Intra-op Plan:   Post-operative Plan:   Informed Consent: I have reviewed the patients History and Physical, chart, labs and discussed the procedure including the risks, benefits and alternatives for the proposed anesthesia with the patient or authorized representative who has indicated his/her understanding and acceptance.   Dental Advisory Given  Plan Discussed with: CRNA  Anesthesia Plan Comments:         Anesthesia Quick Evaluation

## 2017-12-24 NOTE — Anesthesia Post-op Follow-up Note (Signed)
Anesthesia QCDR form completed.        

## 2017-12-24 NOTE — Op Note (Signed)
[facility] Gastroenterology Patient Name: Vanessa Torres Procedure Date: 12/24/2017 11:05 AM MRN: [id number] Account #: [id number] Date of Birth: 1970/08/31 Admit Type: Outpatient Age: 47 Room: [facility] ENDO ROOM 2 Gender: Female Note Status: Finalized Procedure:            Colonoscopy Indications:          Screening for colorectal malignant neoplasm Providers:            Althia Egolf B. Bonna Gains MD, MD Referring MD:         Nino Glow Mclean-Scocuzza MD, MD (Referring MD) Medicines:            Monitored Anesthesia Care Complications:        No immediate complications. Procedure:            Pre-Anesthesia Assessment:                       - ASA Grade Assessment: II - A patient with mild                        systemic disease.                       - Prior to the procedure, a History and Physical was                        performed, and patient medications, allergies and                        sensitivities were reviewed. The patient's tolerance of                        previous anesthesia was reviewed.                       - The risks and benefits of the procedure and the                        sedation options and risks were discussed with the                        patient. All questions were answered and informed                        consent was obtained.                       - Patient identification and proposed procedure were                        verified prior to the procedure by the physician, the                        nurse, the anesthesiologist, the anesthetist and the                        technician. The procedure was verified in the procedure                        room.  After obtaining informed consent, the colonoscope was                        passed under direct vision. Throughout the procedure,                        the patient's blood pressure, pulse, and oxygen                        saturations were monitored  continuously. The                        Colonoscope was introduced through the anus and                        advanced to the the cecum, identified by appendiceal                        orifice and ileocecal valve. The colonoscopy was                        performed with ease. The patient tolerated the                        procedure well. The quality of the bowel preparation                        was good. Findings:      The perianal and digital rectal examinations were normal.      A 5 mm polyp was found in the ascending colon. The polyp was flat. The       polyp was removed with a cold biopsy forceps. Resection and retrieval       were complete.      The exam was otherwise without abnormality.      The rectum, sigmoid colon, descending colon, transverse colon, ascending       colon and cecum appeared normal.      The retroflexed view of the distal rectum and anal verge was normal and       showed no anal or rectal abnormalities. Impression:           - One 5 mm polyp in the ascending colon, removed with a                        cold biopsy forceps. Resected and retrieved.                       - The examination was otherwise normal.                       - The rectum, sigmoid colon, descending colon,                        transverse colon, ascending colon and cecum are normal.                       - The distal rectum and anal verge are normal on                        retroflexion view. Recommendation:       -  Discharge patient to home (with escort).                       - Advance diet as tolerated.                       - Continue present medications.                       - Await pathology results.                       - The findings and recommendations were discussed with                        the patient.                       - The findings and recommendations were discussed with                        the patient's family.                       - Return to primary  care physician as previously                        scheduled.                       - If the pathology report reveals adenomatous tissue,                        then repeat the colonoscopy for surveillance in 5 years.                       - If the pathology report indicates hyperplastic polyp,                        then repeat colonoscopy for screening purposes in 10                        years. Procedure Code(s):    --- Professional ---                       [phone], Colonoscopy, flexible; with biopsy, single or                        multiple Diagnosis Code(s):    --- Professional ---                       Z12.11, Encounter for screening for malignant neoplasm                        of colon                       D12.2, Benign neoplasm of ascending colon CPT copyright 2017 American Medical Association. All rights reserved. The codes documented in this report are preliminary and upon coder review may  be revised to meet current compliance requirements.  Vonda Antigua, MD Margretta Sidle B. Bonna Gains MD, MD 12/24/2017 11:36:50 AM This report has been signed electronically. Number of Addenda:  0 Note Initiated On: 12/24/2017 11:05 AM Scope Withdrawal Time: 0 hours 15 minutes 27 seconds  Total Procedure Duration: 0 hours 26 minutes 27 seconds  Estimated Blood Loss: Estimated blood loss: none.      [facility]

## 2017-12-24 NOTE — Transfer of Care (Signed)
Immediate Anesthesia Transfer of Care Note  Patient: Vanessa Torres  Procedure(s) Performed: COLONOSCOPY WITH PROPOFOL (N/A )  Patient Location: PACU  Anesthesia Type:General  Level of Consciousness: sedated  Airway & Oxygen Therapy: Patient Spontanous Breathing and Patient connected to nasal cannula oxygen  Post-op Assessment: Report given to RN and Post -op Vital signs reviewed and stable  Post vital signs: Reviewed and stable  Last Vitals:  Vitals Value Taken Time  BP 96/59 12/24/2017 11:39 AM  Temp 36.1 C 12/24/2017 11:39 AM  Pulse 73 12/24/2017 11:40 AM  Resp 20 12/24/2017 11:40 AM  SpO2 97 % 12/24/2017 11:40 AM  Vitals shown include unvalidated device data.  Last Pain:  Vitals:   12/24/17 1022  TempSrc: Tympanic  PainSc: 0-No pain         Complications: No apparent anesthesia complications

## 2017-12-24 NOTE — Anesthesia Postprocedure Evaluation (Signed)
Anesthesia Post Note  Patient: Vanessa Torres  Procedure(s) Performed: COLONOSCOPY WITH PROPOFOL (N/A )  Patient location during evaluation: Endoscopy Anesthesia Type: General Level of consciousness: awake and alert Pain management: pain level controlled Vital Signs Assessment: post-procedure vital signs reviewed and stable Respiratory status: spontaneous breathing, nonlabored ventilation and respiratory function stable Cardiovascular status: blood pressure returned to baseline and stable Postop Assessment: no apparent nausea or vomiting Anesthetic complications: no     Last Vitals:  Vitals:   12/24/17 1150 12/24/17 1200  BP: 102/62 107/67  Pulse: 62 (!) 58  Resp: (!) 34 (!) 21  Temp:    SpO2: 100% 100%    Last Pain:  Vitals:   12/24/17 1140  TempSrc: Tympanic  PainSc:                  Vanessa Torres

## 2017-12-24 NOTE — H&P (Signed)
Vanessa Antigua, MD [address], [place], [place], [place], [address] [address], [place], [place], [place], [address] Phone: [phone]  Fax: [phone]  Primary Care Physician:  McLean-Scocuzza, Nino Glow, MD   Pre-Procedure History & Physical: HPI:  Vanessa Torres is a 47 y.o. female is here for a colonoscopy.   Past Medical History:  Diagnosis Date  . Asthma    exercise induced in college   . Brain aneurysm    noted MRI 2004  . Collagen vascular disease ([place])   . Depression    situational  . DVT (deep vein thrombosis) in pregnancy ([place]) 12/2015   left calf  . Fibroid   . GERD (gastroesophageal reflux disease)   . Herpes genitalia 1992  . History of Papanicolaou smear of cervix 12/27/11; 04/18/16   -/-; neg  . HSV-2 (herpes simplex virus 2) infection   . Hyperlipidemia   . Lipoma    left shoulder removed   . Ovarian cyst   . Premature ovarian failure 2013   Dr. Cristino Martes, GYN. Symptoms have resolved  . Pulmonary embolism ([place]) 11/2015   with DVT per pt provoked on international 8 hr flight and was on OCP  . Rheumatoid arthritis ([place])    rheumatoid dx 2015 f/u rheumatology Dr. Rosalia Hammers [facility] worse hands, feet, L thumb  . TMJ (dislocation of temporomandibular joint)     Past Surgical History:  Procedure Laterality Date  . APPENDECTOMY  1997  . INDUCED ABORTION  05/20/1990; 05/21/1995   x2  . KNEE SURGERY Bilateral    x2 each knee  . LIPOMA EXCISION  11/2015   back  . WISDOM TOOTH EXTRACTION      Prior to Admission medications   Medication Sig Start Date End Date Taking? Authorizing Provider  etanercept (ENBREL) 50 MG/ML injection Inject 50 mg into the skin. 04/22/17  Yes [provider]  mupirocin ointment (BACTROBAN) 2 % Apply 1 application topically 2 (two) times daily. Left ear bid x 1 week 07/02/17  Yes McLean-Scocuzza, Nino Glow, MD  valACYclovir (VALTREX) 1000 MG tablet TAKE 1 TABLET BY MOUTH DAILY Patient taking differently: TAKE 1 TABLET  BY MOUTH DAILY prn outbreak 06/30/17  Yes Paulina Fusi, MD    Allergies as of 12/04/2017 - Review Complete 12/03/2017  Allergen Reaction Noted  . Molds & smuts  05/05/2017    Family History  Problem Relation Age of Onset  . Cancer Father        prostate cancer died 86  . Hyperlipidemia Father   . Hypertension Father   . Diabetes Father   . Heart disease Father        Died of HF  . Hyperlipidemia Brother   . Heart disease Maternal Grandmother        CAD - died  . Early death Mother 66       Died of complication from double pna  . Bronchitis Brother        not smoker   . Cancer Paternal Grandfather        prostate  . Breast cancer Neg Hx     Social History   Socioeconomic History  . Marital status: Divorced    Spouse name: Not on file  . Number of children: 0  . Years of education: 87  . Highest education level: Not on file  Occupational History  . Occupation: Personnel officer: [employer]  Social Needs  . Financial resource strain: Not on file  .  Food insecurity:    Worry: Not on file    Inability: Not on file  . Transportation needs:    Medical: Not on file    Non-medical: Not on file  Tobacco Use  . Smoking status: Never Smoker  . Smokeless tobacco: Never Used  Substance and Sexual Activity  . Alcohol use: No  . Drug use: No  . Sexual activity: Not Currently    Birth control/protection: Other-see comments    Comment: perimenopausal  Lifestyle  . Physical activity:    Days per week: Not on file    Minutes per session: Not on file  . Stress: Not on file  Relationships  . Social connections:    Talks on phone: Not on file    Gets together: Not on file    Attends religious service: Not on file    Active member of club or organization: Not on file    Attends meetings of clubs or organizations: Not on file    Relationship status: Not on file  . Intimate partner violence:    Fear of current or ex partner: Not on file     Emotionally abused: Not on file    Physically abused: Not on file    Forced sexual activity: Not on file  Other Topics Concern  . Not on file  Social History Narrative   Shizuko grew up in [place], [place]. She attended [employer] and obtained her Bachelor's Degree in Sociology. She also played on the [employer] [phone]. She is currently the [name] at [employer]. She is divorced. Gowri lives in [place], [place] with her dog, Cece. 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 She also enjoys decorating. 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    Played in [employer]     Review of Systems: See HPI, otherwise negative ROS  Physical Exam: BP 105/70   Pulse 64   Temp (!) 96.8 F (36 C) (Tympanic)   Resp 20   Ht 6' (1.829 m)   Wt 171 lb (77.6 kg)   LMP 12/22/2015   SpO2 98%   BMI 23.19 kg/m  General:   Alert,  pleasant and cooperative in NAD Head:  Normocephalic and atraumatic. Neck:  Supple; no masses or thyromegaly. Lungs:  Clear throughout to auscultation, normal respiratory effort.    Heart:  +S1, +S2, Regular rate and rhythm, No edema. Abdomen:  Soft, nontender and nondistended. Normal bowel sounds, without guarding, and without rebound.   Neurologic:  Alert and  oriented x4;  grossly normal neurologically.  Impression/Plan: Vanessa Torres is here for a colonoscopy to be performed for average risk screening.  Risks, benefits, limitations, and alternatives regarding  colonoscopy have been reviewed with the patient.  Questions have been answered.  All parties agreeable.   Virgel Manifold, MD  12/24/2017, 11:04 AM

## 2017-12-26 LAB — SURGICAL PATHOLOGY

## 2017-12-29 ENCOUNTER — Encounter: Payer: Self-pay | Admitting: Gastroenterology

## 2017-12-29 DIAGNOSIS — D509 Iron deficiency anemia, unspecified: Secondary | ICD-10-CM | POA: Insufficient documentation

## 2017-12-29 NOTE — Progress Notes (Signed)
[place]  [phone] [phone]  ID: Tyrell Antonio OB: Feb 03, 1971  MR#: [id number]  RDE#:[id number]  Patient Care Team: McLean-Scocuzza, Nino Glow, MD as PCP - General (Internal Medicine)  CHIEF COMPLAINT: Bilateral pulmonary embolism, microcytic anemia.  INTERVAL HISTORY: Patient last evaluated in clinic in October 2017.  She is referred back for history of anemia and further evaluation.  She continues to feel well and remains asymptomatic.  She has no neurologic complaints.  She denies any recent fevers or illnesses.  She has a good appetite and denies weight loss.  She denies any chest pain or shortness of breath. She has no nausea, vomiting, constipation, or diarrhea. She has no urinary complaints.  Patient feels at her baseline offers no specific complaints today.  REVIEW OF SYSTEMS:   Review of Systems  Constitutional: Negative.  Negative for fever, malaise/fatigue and weight loss.  Respiratory: Negative.  Negative for cough, hemoptysis and shortness of breath.   Cardiovascular: Negative.  Negative for chest pain and leg swelling.  Gastrointestinal: Negative.  Negative for abdominal pain, blood in stool and melena.  Genitourinary: Negative.  Negative for hematuria.  Musculoskeletal: Negative.  Negative for back pain.  Skin: Negative.  Negative for rash.  Neurological: Negative.  Negative for focal weakness, weakness and headaches.  Endo/Heme/Allergies: Does not bruise/bleed easily.  Psychiatric/Behavioral: Negative.  The patient is not nervous/anxious.    As per HPI. Otherwise, a complete review of systems is negative.   PAST MEDICAL HISTORY: Past Medical History:  Diagnosis Date  . Asthma    exercise induced in college   . Brain aneurysm    noted MRI 2004  . Collagen vascular disease ([place])   . Depression    situational  . DVT (deep vein thrombosis) in pregnancy ([place]) 12/2015   left calf  . Fibroid   . GERD (gastroesophageal  reflux disease)   . Herpes genitalia 1992  . History of Papanicolaou smear of cervix 12/27/11; 04/18/16   -/-; neg  . HSV-2 (herpes simplex virus 2) infection   . Hyperlipidemia   . Lipoma    left shoulder removed   . Ovarian cyst   . Premature ovarian failure 2013   Dr. Cristino Martes, GYN. Symptoms have resolved  . Pulmonary embolism ([place]) 11/2015   with DVT per pt provoked on international 8 hr flight and was on OCP  . Rheumatoid arthritis ([place])    rheumatoid dx 2015 f/u rheumatology Dr. Rosalia Hammers [facility] worse hands, feet, L thumb  . TMJ (dislocation of temporomandibular joint)     PAST SURGICAL HISTORY: Past Surgical History:  Procedure Laterality Date  . APPENDECTOMY  1997  . COLONOSCOPY WITH PROPOFOL N/A 12/24/2017   Procedure: COLONOSCOPY WITH PROPOFOL;  Surgeon: Virgel Manifold, MD;  Location: [facility] ENDOSCOPY;  Service: Endoscopy;  Laterality: N/A;  . INDUCED ABORTION  05/20/1990; 05/21/1995   x2  . KNEE SURGERY Bilateral    x2 each knee  . LIPOMA EXCISION  11/2015   back  . WISDOM TOOTH EXTRACTION      FAMILY HISTORY: Family History  Problem Relation Age of Onset  . Cancer Father        prostate cancer died 31  . Hyperlipidemia Father   . Hypertension Father   . Diabetes Father   . Heart disease Father        Died of HF  . Hyperlipidemia Brother   . Heart disease Maternal Grandmother        CAD - died  .  Early death Mother 6       Died of complication from double pna  . Bronchitis Brother        not smoker   . Cancer Paternal Grandfather        prostate  . Breast cancer Neg Hx        ADVANCED DIRECTIVES (Y/N):  N   HEALTH MAINTENANCE: Social History   Tobacco Use  . Smoking status: Never Smoker  . Smokeless tobacco: Never Used  Substance Use Topics  . Alcohol use: No  . Drug use: No     Colonoscopy:  PAP:  Bone density:  Lipid panel:  Allergies  Allergen Reactions  . Molds & Smuts     Current Outpatient Medications  Medication Sig Dispense  Refill  . etanercept (ENBREL) 50 MG/ML injection Inject 50 mg into the skin.    . methotrexate (RHEUMATREX) 2.5 MG tablet Take 8 tablets by mouth once a week.    . mupirocin ointment (BACTROBAN) 2 % Apply 1 application topically 2 (two) times daily. Left ear bid x 1 week 22 g 0  . valACYclovir (VALTREX) 1000 MG tablet TAKE 1 TABLET BY MOUTH DAILY (Patient taking differently: TAKE 1 TABLET BY MOUTH DAILY prn outbreak) 30 tablet 3   No current facility-administered medications for this visit.     OBJECTIVE: Vitals:   01/01/18 1133 01/01/18 1138  BP:  104/71  Pulse:  60  Resp: 16   Temp:  98 F (36.7 C)     Body mass index is 22.68 kg/m.    ECOG FS:0 - Asymptomatic  General: Well-developed, well-nourished, no acute distress. Eyes: Pink conjunctiva, anicteric sclera. HEENT: Normocephalic, moist mucous membranes, clear oropharnyx. Lungs: Clear to auscultation bilaterally. Heart: Regular rate and rhythm. No rubs, murmurs, or gallops. Abdomen: Soft, nontender, nondistended. No organomegaly noted, normoactive bowel sounds. Musculoskeletal: No edema, cyanosis, or clubbing. Neuro: Alert, answering all questions appropriately. Cranial nerves grossly intact. Skin: No rashes or petechiae noted. Psych: Normal affect. Lymphatics: No cervical, calvicular, axillary or inguinal LAD.  LAB RESULTS:  Lab Results  Component Value Date   NA 138 07/03/2017   K 4.0 07/03/2017   CL 104 07/03/2017   CO2 29 07/03/2017   GLUCOSE 88 07/03/2017   BUN 13 07/03/2017   CREATININE 0.86 07/03/2017   CALCIUM 9.7 07/03/2017   PROT 6.9 07/03/2017   ALBUMIN 4.1 07/03/2017   AST 19 07/03/2017   ALT 18 07/03/2017   ALKPHOS 65 07/03/2017   BILITOT 0.6 07/03/2017   GFRNONAA >60 01/25/2016   GFRAA >60 01/25/2016    Lab Results  Component Value Date   WBC 4.9 01/01/2018   NEUTROABS 2.0 01/01/2018   HGB 13.1 01/01/2018   HCT 39.6 01/01/2018   MCV 80.2 01/01/2018   PLT 178 01/01/2018   Lab Results    Component Value Date   IRON 85 01/01/2018   TIBC 384 01/01/2018   IRONPCTSAT 22 01/01/2018   Lab Results  Component Value Date   FERRITIN 19 01/01/2018     STUDIES: No results found.  ASSESSMENT: Bilateral pulmonary embolism, microcytic anemia.  PLAN:    1.  Microcytic anemia: Patient's hemoglobin and iron stores are within normal limits today.  Hemoglobinopathy profile reveals a normal adult hemoglobin.  No intervention is needed at this time.  After discussion with the patient, it was agreed upon that no further follow-up is needed.  Please refer patient back if there are any questions or concerns. 2.  History of bilateral  pulmonary embolism: Patient had multiple transient risk factors included extended traveled to [place] as well as on birth control pills.  Full hypercoagulable workup is negative.  Patient is no longer taking Eliquis.  Although her hypercoagulable workup was negative, she is at risk for a second DVT given her history. She does not require long-term anticoagulation but she was educated on avoiding transient risk factors particularly during long travel. If patient ever had a second DVT, would recommend lifelong anticoagulation at that time.  I spent a total of 30 minutes face-to-face with the patient of which greater than 50% of the visit was spent in counseling and coordination of care as detailed above.    Patient expressed understanding and was in agreement with this plan. She also understands that She can call clinic at any time with any questions, concerns, or complaints.    Lloyd Huger, MD   01/05/2018 1:14 PM

## 2018-01-01 ENCOUNTER — Encounter: Payer: Self-pay | Admitting: Oncology

## 2018-01-01 ENCOUNTER — Inpatient Hospital Stay: Payer: BLUE CROSS/BLUE SHIELD | Attending: Oncology | Admitting: Oncology

## 2018-01-01 ENCOUNTER — Other Ambulatory Visit: Payer: Self-pay

## 2018-01-01 ENCOUNTER — Inpatient Hospital Stay: Payer: BLUE CROSS/BLUE SHIELD

## 2018-01-01 VITALS — BP 104/71 | HR 60 | Temp 98.0°F | Resp 16 | Ht 72.0 in | Wt 167.2 lb

## 2018-01-01 DIAGNOSIS — I2699 Other pulmonary embolism without acute cor pulmonale: Secondary | ICD-10-CM

## 2018-01-01 DIAGNOSIS — Z86711 Personal history of pulmonary embolism: Secondary | ICD-10-CM | POA: Insufficient documentation

## 2018-01-01 DIAGNOSIS — D509 Iron deficiency anemia, unspecified: Secondary | ICD-10-CM | POA: Diagnosis present

## 2018-01-01 LAB — CBC WITH DIFFERENTIAL/PLATELET
BASOS ABS: 0 10*3/uL (ref 0–0.1)
Basophils Relative: 0 %
Eosinophils Absolute: 0.1 10*3/uL (ref 0–0.7)
Eosinophils Relative: 2 %
HEMATOCRIT: 39.6 % (ref 35.0–47.0)
HEMOGLOBIN: 13.1 g/dL (ref 12.0–16.0)
LYMPHS PCT: 52 %
Lymphs Abs: 2.6 10*3/uL (ref 1.0–3.6)
MCH: 26.5 pg (ref 26.0–34.0)
MCHC: 33 g/dL (ref 32.0–36.0)
MCV: 80.2 fL (ref 80.0–100.0)
MONO ABS: 0.3 10*3/uL (ref 0.2–0.9)
Monocytes Relative: 6 %
NEUTROS ABS: 2 10*3/uL (ref 1.4–6.5)
NEUTROS PCT: 40 %
Platelets: 178 10*3/uL (ref 150–440)
RBC: 4.94 MIL/uL (ref 3.80–5.20)
RDW: 14.5 % (ref 11.5–14.5)
WBC: 4.9 10*3/uL (ref 3.6–11.0)

## 2018-01-01 LAB — FERRITIN: FERRITIN: 19 ng/mL (ref 11–307)

## 2018-01-01 LAB — IRON AND TIBC
Iron: 85 ug/dL (ref 28–170)
Saturation Ratios: 22 % (ref 10.4–31.8)
TIBC: 384 ug/dL (ref 250–450)
UIBC: 299 ug/dL

## 2018-01-01 NOTE — Progress Notes (Signed)
Patient here for follow up no changes since last appointment 

## 2018-01-02 LAB — HEMOGLOBINOPATHY EVALUATION
HGB F QUANT: 0 % (ref 0.0–2.0)
HGB S QUANTITAION: 0 %
Hgb A2 Quant: 2.4 % (ref 1.8–3.2)
Hgb A: 97.6 % (ref 96.4–98.8)
Hgb C: 0 %
Hgb Variant: 0 %

## 2018-01-13 NOTE — Unmapped (Signed)
Patient is doing well on the enbrel at this time    [facility] Refill Coordination Note    Specialty Medication(s) to be Shipped:   Inflammatory Disorders: Enbrel    Other medication(s) to be shipped: n/a     [name], DOB: [birth year]  Phone: [phone] (home)   Shipping Address: [address]  [place] [address]    All above HIPAA information was verified with patient.     Completed refill call assessment today to schedule patient's medication shipment from the [facility] [phone]).       Specialty medication(s) and dose(s) confirmed: Regimen is correct and unchanged.   Changes to medications: Clarann reports no changes reported at this time.  Changes to insurance: No  Questions for the pharmacist: No    The patient will receive a drug information handout for each medication shipped and additional FDA Medication Guides as required.      DISEASE/MEDICATION-SPECIFIC INFORMATION        For Rheumatology patients: Next dose of enbrel from this shipment due on 9/9    ADHERENCE     Medication Adherence    Patient reported X missed doses in the last month:  0  Specialty Medication:  ENBREL  Patient is on additional specialty medications:  No  Patient is on more than two specialty medications:  No  Any gaps in refill history greater than 2 weeks in the last 3 months:  no  Demonstrates understanding of importance of adherence:  yes  Informant:  patient  Reliability of informant:  reliable  Confirmed plan for next specialty medication refill:  delivery by pharmacy  Refills needed for supportive medications:  not needed          Refill Coordination    Has the Patients' Contact Information Changed:  No  Is the Shipping Address Different:  No           SHIPPING     Shipping address confirmed in Epic.     Delivery Scheduled: Yes, Expected medication delivery date: 8/30 via UPS or courier.     Renette Butters   [facility] Specialty Technician

## 2018-01-15 MED FILL — ENBREL SURECLICK 50 MG/ML (1 ML) SUBCUTANEOUS PEN INJECTOR: 28 days supply | Qty: 4 | Fill #0 | Status: AC

## 2018-02-03 NOTE — Unmapped (Signed)
[facility] Clinical Assessment Telephone Call   Medication: Enbrel    Unable to reach patient to complete required clinical assessment for Enbrel filled through the [facility].     First attempt to reach patient (02/03/18).  Second attempt (02/04/18)  Left voicemail. Patient may call me back at [phone] to complete clinical assessment.    Jeneen Montgomery

## 2018-02-05 NOTE — Unmapped (Signed)
Med refill

## 2018-02-06 NOTE — Unmapped (Signed)
Med was refilled on 11/14/2017.  Message left on patients personal vm to call [facility] to have her medication sent to her.

## 2018-02-06 NOTE — Unmapped (Signed)
Med refill

## 2018-02-10 NOTE — Unmapped (Signed)
Patient is doing well at this time  She has one pen of enbrel left on hand - she will use on Monday    [facility] Refill Coordination Note    Specialty Medication(s) to be Shipped:   Inflammatory Disorders: Enbrel    Other medication(s) to be shipped: n/a     Tracie Dixon, DOB: [birth year]  Phone: [phone] (home)   Shipping Address: [address]  [place] [address]    All above HIPAA information was verified with patient.     Completed refill call assessment today to schedule patient's medication shipment from the [facility] [phone]).       Specialty medication(s) and dose(s) confirmed: Regimen is correct and unchanged.   Changes to medications: Tracie Dixon reports no changes reported at this time.  Changes to insurance: No  Questions for the pharmacist: No    The patient will receive a drug information handout for each medication shipped and additional FDA Medication Guides as required.      DISEASE/MEDICATION-SPECIFIC INFORMATION        For Rheumatology patients: Next dose of enbrel from this shipment due on 10/7    ADHERENCE        She hasn't missed any doses that she remembers    [facility]     Shipping address confirmed in Epic.     Delivery Scheduled: Yes, Expected medication delivery date: 9/27 via UPS or courier.     Renette Butters   [facility] Specialty Technician

## 2018-02-12 MED FILL — ENBREL SURECLICK 50 MG/ML (1 ML) SUBCUTANEOUS PEN INJECTOR: 28 days supply | Qty: 4 | Fill #1

## 2018-02-12 MED FILL — ENBREL SURECLICK 50 MG/ML (1 ML) SUBCUTANEOUS PEN INJECTOR: 28 days supply | Qty: 4 | Fill #1 | Status: AC

## 2018-02-23 NOTE — Unmapped (Signed)
[facility]: Rheumatology Clinic Assessment Call    Specialty Medication(s): Enbrel  Indication(s): Rheumatoid arthritis     Tracie Dixon, DOB: 18-Apr-1971  Above HIPAA information was verified with patient.      Medications reviewed & verified: Allergies - Medications -      Specialty medication(s) & dose(s) confirmed: yes  Changes to medications: no  Tolerating medications:   Adverse Effects    *All other systems reviewed and are negative          CLINICAL ASSESSMENT     Tracie Dixon reports tolerating Enbrel well without adverse effects.  Adherence to therapy confirmed with patient and refill record @ [facility].  Patient reports zero missed dose(s) of Enbrel the past few month(s).  Clinically, rheumatoid arthritis is better after adding methotrexate at last clinic appointment.  As therapy is effective, appropriate to continue at this time.      Does Tracie Dixon have follow up appointment scheduled with clinic? Yes, appointment is scheduled and patient is aware    All questions were answered and contact information provided for any future questions/concerns.      Tracie Dixon

## 2018-03-05 NOTE — Unmapped (Signed)
[facility] Refill Coordination Note  Specialty Medication(s): Enbrel  Additional Medications shipped: none    Tracie Dixon, DOB: [birth year]  Phone: [phone] (home) , Alternate phone contact: N/A  Phone or address changes today?: No  All above HIPAA information was verified with patient.  Shipping Address: [address]  [place] [address]   Insurance changes? No    Completed refill call assessment today to schedule patient's medication shipment from the [facility] [phone]).      Confirmed the medication and dosage are correct and have not changed: Yes, regimen is correct and unchanged.    Confirmed patient started or stopped the following medications in the past month:  No, there are no changes reported at this time.    Are you tolerating your medication?:  Tracie Dixon reports tolerating the medication.    ADHERENCE    (Below is required for Medicare Part B or Transplant patients only - per drug):   How many tablets were dispensed last month: 4 pens  Patient currently has 1 pen remaining. (Next dose due 03/09/18)    Did you miss any doses in the past 4 weeks? No missed doses reported.    FINANCIAL/SHIPPING    Delivery Scheduled: Yes, Expected medication delivery date: 03/10/2018     Medication will be delivered via UPS to the home address in [facility].    The patient will receive a drug information handout for each medication shipped and additional FDA Medication Guides as required.      Jhade did not have any additional questions at this time.    We will follow up with patient monthly for standard refill processing and delivery.      Thank you,  Breck Coons Shared [facility] Specialty Pharmacist

## 2018-03-09 MED FILL — ENBREL SURECLICK 50 MG/ML (1 ML) SUBCUTANEOUS PEN INJECTOR: 28 days supply | Qty: 4 | Fill #2

## 2018-03-09 MED FILL — ENBREL SURECLICK 50 MG/ML (1 ML) SUBCUTANEOUS PEN INJECTOR: 28 days supply | Qty: 4 | Fill #2 | Status: AC

## 2018-03-17 ENCOUNTER — Ambulatory Visit: Payer: BLUE CROSS/BLUE SHIELD | Admitting: Medical

## 2018-03-17 ENCOUNTER — Ambulatory Visit
Admission: RE | Admit: 2018-03-17 | Discharge: 2018-03-17 | Disposition: A | Discharge status: Home / Self Care | Payer: BLUE CROSS/BLUE SHIELD | Source: Ambulatory Visit | Attending: Medical | Admitting: Medical

## 2018-03-17 ENCOUNTER — Other Ambulatory Visit: Payer: Self-pay | Admitting: Family Medicine

## 2018-03-17 ENCOUNTER — Encounter: Payer: Self-pay | Admitting: Medical

## 2018-03-17 VITALS — BP 112/68 | HR 73 | Temp 99.1°F | Resp 16 | Wt 167.8 lb

## 2018-03-17 DIAGNOSIS — R0789 Other chest pain: Secondary | ICD-10-CM

## 2018-03-17 DIAGNOSIS — Z1231 Encounter for screening mammogram for malignant neoplasm of breast: Secondary | ICD-10-CM

## 2018-03-17 NOTE — Progress Notes (Signed)
   Subjective:    Patient ID: Vanessa Torres, female    DOB: 1970-09-23, 47 y.o.   MRN: [id number]  HPI  47 yo female in non acute distress is the womens basketball coach at [employer]. Presents with complaints of   enderness 3/10 "I just notice it" on mid  breast bone area x 3-4 months. Seen by Rheumatologist and was told she may get inflammation in the breast bone with Rheumatoid  Arthritis.   Rash inside ear but not in canal bilateral. Primary doctor gave  Bactroban at [place]  4 months ago.Using it once a day with no improvement.    Blood pressure 112/68, pulse 73, temperature 99.1 F (37.3 C), temperature source Tympanic, resp. rate 16, weight 167 lb 12.8 oz (76.1 kg), last menstrual period 12/22/2015, SpO2 99 %.  Allergies  Allergen Reactions  . Molds & Smuts     Congestion in the nose and chest   Review of Systems  Constitutional: Negative for chills.  Eyes: Negative.   Respiratory: Positive for shortness of breath (with talking sometimes last  6 months ).   Cardiovascular: Positive for chest pain. Negative for palpitations and leg swelling.  Gastrointestinal: Negative for abdominal pain.  Endocrine: Negative for polydipsia, polyphagia and polyuria.  Genitourinary: Negative for dysuria and hematuria.  Musculoskeletal: Negative for myalgias.  Skin: Positive for color change (left auricle and right auricle and behind ear).  Allergic/Immunologic: Positive for environmental allergies. Negative for food allergies.  Neurological: Negative for dizziness, syncope, facial asymmetry and headaches.  Psychiatric/Behavioral: Negative for behavioral problems, confusion, self-injury and suicidal ideas. The patient is not nervous/anxious.        Objective:   Physical Exam  Constitutional: She is oriented to person, place, and time. She appears well-developed and well-nourished.  HENT:  Head: Normocephalic and atraumatic.  Eyes: Pupils are equal, round, and reactive to  light. EOM are normal.  Cardiovascular: Normal rate. An irregularly irregular rhythm present.    Pulmonary/Chest: Effort normal and breath sounds normal.  Neurological: She is alert and oriented to person, place, and time.  Skin: Skin is warm and dry. Rash (in auricle of ears bilaterally) noted.  Psychiatric: She has a normal mood and affect. Her behavior is normal.  Nursing note and vitals reviewed.  Area marked on diagram most likely at the angle of the sterum., mildly ternder to palp        Assessment & Plan:  Chest pain at mid sterum possible arthritis. Will do x-ray of sternum and PA of chest.  Will contact patient tomorrow with test results.  To contact [facility] for appointment for rash in Bilateral ears. Possibly Fungal vs Ecema. Located  In the [employer] and [place]. Of the Auricle of th4e ears bilaterally.  Will call patient in the morning with results. Patient verbalizes understanding and has no questions at discharge.

## 2018-03-17 NOTE — Patient Instructions (Signed)
Chest X-Ray A chest X-ray is a painless test that uses radiation to create images of the structures inside of your chest. Chest X-rays are used to look for many health conditions, including heart failure, pneumonia, tuberculosis, rib fractures, breathing disorders, and cancer. They may be used to diagnose chest pain, constant coughing, or trouble breathing. Tell a health care provider about:  Any allergies you have.  All medicines you are taking, including vitamins, herbs, eye drops, creams, and over-the-counter medicines.  Any surgeries you have had.  Any medical conditions you have.  Whether you are pregnant or may be pregnant. What are the risks? Getting a chest X-ray is a safe procedure. However, you will be exposed to a small amount of radiation. Being exposed to too much radiation over a lifetime can increase the risk of cancer. This risk is small, but it may occur if you have many X-rays throughout your life. What happens before the procedure?  You may be asked to remove glasses, jewelry, and any other metal objects.  You will be asked to undress from the waist up. You may be given a hospital gown to wear.  You may be asked to wear a protective lead apron to protect parts of your body from radiation. What happens during the procedure?  You will be asked to stand still as each picture is taken to get the best possible images.  You will be asked to take a deep breath and hold your breath for a few seconds.  The X-ray machine will create a picture of your chest using a tiny burst of radiation. This is painless.  More pictures may be taken from other angles. Typically, one picture will be taken while you face the X-ray camera, and another picture will be taken from the side while you stand. If you cannot stand, you may be asked to lie down. The procedure may vary among health care providers and hospitals. What happens after the procedure?  The X-ray(s) will be reviewed by your  health care provider or an X-ray (radiology) specialist.  It is up to you to get your test results. Ask your health care provider, or the department that is doing the test, when your results will be ready.  Your health care provider will tell you if you need more tests or a follow-up exam. Keep all follow-up visits as told by your health care provider. This is important. Summary  A chest X-ray is a safe, painless test that is used to examine the inside of the chest, heart, and lungs.  You will need to undress from the waist up and remove jewelry and metal objects before the procedure.  You will be exposed to a small amount of radiation during the procedure.  The X-ray machine will take one or more pictures of your chest while you remain as still as possible.  Later, a health care provider or specialist will review the test results with you. This information is not intended to replace advice given to you by your health care provider. Make sure you discuss any questions you have with your health care provider. Document Released: 07/02/2016 Document Revised: 07/02/2016 Document Reviewed: 07/02/2016 Elsevier Interactive Patient Education  2018 Elsevier Inc.  

## 2018-03-18 ENCOUNTER — Telehealth: Payer: Self-pay | Admitting: Medical

## 2018-03-18 NOTE — Telephone Encounter (Signed)
Called patient and reviewed chest and sternal xrays.  May use Tylenol for pain , reviewed it perhaps is  costrochondritis with patient. She may do Tylenol x one week. If not improving to follow up with her Rheumatologist. She verbalizes understanding and has no questions at the end of our conversation.  Xray results below. CLINICAL DATA:  Pt states no injury, pain mid sternum for 4 months, no chest complaints. History of RA, no prior cardiac history. shielded  EXAM: CHEST  1 VIEW  COMPARISON:  06/21/2015  FINDINGS: Mild pulmonary hyperinflation.  No focal infiltrate or overt edema.  Heart size and mediastinal contours are within normal limits.  No effusion.  No pneumothorax.  Visualized bones unremarkable.  IMPRESSION: No acute cardiopulmonary disease.   Electronically Signed   By: Lucrezia Europe M.D.   On: 03/18/2018 08:17 Study Result   CLINICAL DATA:  Pt states no injury, pain mid sternum for 4 months, no chest complaints. History of RA, no prior cardiac history. shielded  EXAM: STERNUM - 2+ VIEW  COMPARISON:  CT 01/25/2016  FINDINGS: There is no evidence of fracture or other focal bone lesions.  IMPRESSION: Negative.   Electronically Signed   By: Eden Emms.D.

## 2018-03-22 NOTE — Unmapped (Signed)
PCP: Dr Reece Packer    HPI: Ms. Byrns is a 47 year old woman with RA diagnosed in 2015.  She had been managed with MTX monotherapy with an inadequate response.   Etanercept was added in February 2018.   She stopped the MTX soon after that. In Dec 2018 she was doing well on Etanercept monotherapy.       I saw her this past summer with increased RA disease activity and the MTX was added back.  She is now on combination therapy with Etanercept 50 mg weekly and Methotexate 20 mg weekly.  She is feeling well.  She is doing better now that she is on combination therapy.  SHe has not had any significant AM stiffness or flares of peripheral arthritis.     She reports some pain in the sternum.  This is intermittent. She was seen by a local physician and had a normal chest xray.      She recently had a colonoscopy at [facility] and had a benign polyp.    Basketball season has started and her first game is today.  SHe is excited about the season.     No fever, chills, or night sweats. She has fatigue but is sleeping well. Flu shot was done.       PMH:  DVT and PE -- provoked. Now off anticoagulation  RA  Diagnosed 2015    MEDS:  Medication Sig   ??? etanercept (ENBREL) injection 50 mg/mL PEN INJECT THE CONTENTS OF 1 AUTOINJECTOR (50MG ) UNDER THE SKIN ONCE EACH WEEK   ??? folic acid (FOLVITE) 1 MG tablet Take 1 tablet (1 mg total) by mouth daily.   ??? methotrexate 2.5 MG tablet Take 8 tablets (20 mg total) by mouth once a week.   ??? multivitamin (TAB-A-VITE/THERAGRAN) per tablet Take 1 tablet by mouth daily.   ??? valACYclovir (VALTREX) 1000 MG tablet Take 1,000 mg by mouth daily as needed.       Allergies  NONE KNOWN    Immunizations:  Immunization History   Administered Date(s) Administered   ??? INFLUENZA TIV (TRI) 70MO+ W/ PRESERV (IM) 02/20/2015   ??? Influenza Vaccine Quad (IIV4 PF) 29mo+ injectable 07/02/2016   ??? Influenza Virus Vaccine, unspecified formulation 03/12/2018   ??? 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  She previously played in the [employer]  Never smoker  No EtOH  No drugs  Wears seatbelts    ROS: 10 systems are reviewed and are negative except HPI    Examination:  BP 103/70 (BP Site: R Arm, BP Position: Sitting, BP Cuff Size: Medium)  - Pulse 67  - Temp 36.3 ??C (97.4 ??F) (Oral)  - Ht 182.9 cm (6' 0.01)  - Wt 75.5 kg (166 lb 8 oz)  - BMI 22.58 kg/m??   General: well-appearing and in no distress  HEENT: Head is normocephalic and atraumatic. Pupils equal round and reactive to light.  Oropharynx is clear.  Neck: supple without JVD, adenopathy, or thyromegaly  Lungs: Clear to A& P  Heart: RRR without murmurs, rubs, or gallops  Abdomen: soft and non-tender  Extremities: no clubbing, cyanosis or edema  Neuro: no focal motor or sensory deficits  Skin: no rashes  MSK: Oral aperture  is normal.  There is normal range of motion of the neck without tenderness. There is full painless range of motion of the shoulders without glenohumeral effusions. The sternum is normal in appearance without palpable abnormalities or tenderness. The elbows are cool without nodules, synovitis, or limitation in ROM. There is no active synovitis of the small joints of the hands.  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  There is a chronic deformity of the R 4th digit , also traumatic.   The wrists and  are not tender or swollen.  The grip strength is full.  There is good flexion, internal and external rotation of the hips.  The knees are cool without effusion and the ROM is full. There is crepitus of both knees.  There is normal ROM of the R tibiotalar and subtalar joints. The L ankle not swollen, there is some decrease ROM of the tibiotalar and subtalar joints.  There is no tenderness or swelling of the MTPs. There is no significant soft tissue tenderness.     IMAGING NOV 2017  FEET  Right hallux valgus and metatarsus primus varus with mild lateral subluxation at the right great toe MTP joint and adjacent soft tissue swelling. There is a dorsal medial first metatarsal by dictated. There is also mild hallux valgus and metatarsus primus varus on the left with focal soft tissue swelling adjacent to the left great toe MTP joint. There is a small left dorsal medial first metatarsal bunion. Mild joint space narrowing and marginal osteophytosis of the right great toe MTP joint. Remaining joint spaces are normal. Erosion is of the left fifth metatarsal head. 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 Marginal erosion of the right fifth metacarpal head. There are also question small marginal erosions at the bases of the left second, third, and fifth proximal phalanges. Focal soft tissue swelling over the MCP joints. No osteophytosis or chondrocalcinosis. Overall bone density appears normal.    Office Visit on 03/24/2018   Component Date Value   ??? AST 03/24/2018 28    ??? ALT 03/24/2018 17    ??? CRP 03/24/2018 <5.0    ??? WBC 03/24/2018 5.4    ??? RBC 03/24/2018 4.99    ??? HGB 03/24/2018 13.0    ??? HCT 03/24/2018 40.8    ??? MCV 03/24/2018 81.9    ??? [facility] 03/24/2018 26.2    ??? MCHC 03/24/2018 32.0    ??? RDW 03/24/2018 15.3*   ??? MPV 03/24/2018 8.1    ??? Platelet 03/24/2018 169    ??? Neutrophils % 03/24/2018 57.1    ??? Lymphocytes % 03/24/2018 32.8    ??? Monocytes % 03/24/2018 5.4    ??? Eosinophils % 03/24/2018 2.9    ??? Basophils % 03/24/2018 0.5    ??? Absolute Neutrophils 03/24/2018 3.1    ??? Absolute Lymphocytes 03/24/2018 1.8    ??? Absolute Monocytes 03/24/2018 0.3    ??? Absolute Eosinophils 03/24/2018 0.2    ??? Absolute Basophils 03/24/2018 0.0    ??? Large Unstained Cells 03/24/2018 1    ??? Microcytosis 03/24/2018 Slight*   ??? Hypochromasia 03/24/2018 Slight*   ]      IMPRESSION ANS PLANS: Ms. Gibby is a 47 year old woman with seropositive and erosive RA diagnosed in 2015. She is doing much better on combination therapy with MTX and Etanercept.  Markers of inflammation are normal and there is no active synovitis.  She is tolerating her medications and has not had any toxicity.        1. Continue Etanercept 50 mg weekly  2. Continue MTX 20 mg weekly with folic acide 1 mg daily  3. Labs updated and reviewed above.  No concerns.   4. PPSV 23 should be done at the next visit.   5. Follow up in 4 months with Danne Harbor and in 8 months with me.

## 2018-03-24 ENCOUNTER — Encounter
Admit: 2018-03-24 | Discharge: 2018-03-25 | Payer: PRIVATE HEALTH INSURANCE | Attending: Rheumatology | Primary: Rheumatology

## 2018-03-24 DIAGNOSIS — M0579 Rheumatoid arthritis with rheumatoid factor of multiple sites without organ or systems involvement: Principal | ICD-10-CM

## 2018-03-24 LAB — CBC W/ AUTO DIFF
BASOPHILS ABSOLUTE COUNT: 0 10*9/L (ref 0.0–0.1)
BASOPHILS RELATIVE PERCENT: 0.5 %
EOSINOPHILS RELATIVE PERCENT: 2.9 %
HEMATOCRIT: 40.8 % (ref 36.0–46.0)
HEMOGLOBIN: 13 g/dL (ref 12.0–16.0)
LARGE UNSTAINED CELLS: 1 % (ref 0–4)
LYMPHOCYTES ABSOLUTE COUNT: 1.8 10*9/L (ref 1.5–5.0)
LYMPHOCYTES RELATIVE PERCENT: 32.8 %
MEAN CORPUSCULAR HEMOGLOBIN CONC: 32 g/dL (ref 31.0–37.0)
MEAN CORPUSCULAR HEMOGLOBIN: 26.2 pg (ref 26.0–34.0)
MEAN CORPUSCULAR VOLUME: 81.9 fL (ref 80.0–100.0)
MEAN PLATELET VOLUME: 8.1 fL (ref 7.0–10.0)
MONOCYTES ABSOLUTE COUNT: 0.3 10*9/L (ref 0.2–0.8)
MONOCYTES RELATIVE PERCENT: 5.4 %
NEUTROPHILS ABSOLUTE COUNT: 3.1 10*9/L (ref 2.0–7.5)
NEUTROPHILS RELATIVE PERCENT: 57.1 %
PLATELET COUNT: 169 10*9/L (ref 150–440)
RED BLOOD CELL COUNT: 4.99 10*12/L (ref 4.00–5.20)
RED CELL DISTRIBUTION WIDTH: 15.3 % — ABNORMAL HIGH (ref 12.0–15.0)

## 2018-03-24 LAB — AST (SGOT): Aspartate aminotransferase:CCnc:Pt:Ser/Plas:Qn:: 28

## 2018-03-24 LAB — MEAN PLATELET VOLUME: Lab: 8.1

## 2018-03-24 LAB — ALT (SGPT): Alanine aminotransferase:CCnc:Pt:Ser/Plas:Qn:: 17

## 2018-03-24 LAB — C-REACTIVE PROTEIN: C reactive protein:MCnc:Pt:Ser/Plas:Qn:: <5

## 2018-03-24 NOTE — Unmapped (Signed)
Tracie Dixon was seen today for chronic condition follow-up.    Diagnoses and all orders for this visit:    Rheumatoid arthritis involving multiple sites with positive rheumatoid factor (CMS-HCC)  -     CBC w/ Differential; Future  -     AST; Future  -     ALT; Future  -     CRP  C-Reactive Protein; Future    Continue current medical therapy  ENBREL weekly  MTX 20 mg weekly  Folic acid 1 mg daily

## 2018-04-02 NOTE — Unmapped (Signed)
[facility] Refill Coordination Note    Specialty Medication(s) to be Shipped:   Inflammatory Disorders: Enbrel    Other medication(s) to be shipped: Jeri Modena, DOB: Oct 25, 1970  Phone: [phone] (home)       All above HIPAA information was verified with patient.     Completed refill call assessment today to schedule patient's medication shipment from the [facility] [phone]).       Specialty medication(s) and dose(s) confirmed: Regimen is correct and unchanged.   Changes to medications: Angelin reports no changes reported at this time.  Changes to insurance: No  Questions for the pharmacist: No    The patient will receive a drug information handout for each medication shipped and additional FDA Medication Guides as required.      DISEASE/MEDICATION-SPECIFIC INFORMATION        For Inflammatory disorders patients on injectable medications: Patient currently has 2 doses left.  Next injection is scheduled for 04/06/18.    ADHERENCE     Medication Adherence    Patient reported X missed doses in the last month:  0                          MEDICARE PART B DOCUMENTATION     Not Applicable    SHIPPING     Shipping address confirmed in Epic.     Delivery Scheduled: Yes, Expected medication delivery date: 04/08/18 via UPS or courier.     Medication will be delivered via UPS to the home address in Epic [place].    Mitzi Davenport   [facility] Specialty Technician

## 2018-04-07 MED FILL — ENBREL SURECLICK 50 MG/ML (1 ML) SUBCUTANEOUS PEN INJECTOR: 28 days supply | Qty: 4 | Fill #3

## 2018-04-07 MED FILL — ENBREL SURECLICK 50 MG/ML (1 ML) SUBCUTANEOUS PEN INJECTOR: 28 days supply | Qty: 4 | Fill #3 | Status: AC

## 2018-04-27 ENCOUNTER — Ambulatory Visit: Payer: BLUE CROSS/BLUE SHIELD | Admitting: Gastroenterology

## 2018-05-05 NOTE — Unmapped (Signed)
[facility] Refill Coordination Note  Specialty Medication(s): Enbrel Sureclick 50mg /ml  Additional Medications shipped: N/A    Tracie Dixon, DOB: Dec 24, 1970  Phone: [phone] (home) , Alternate phone contact: N/A  Phone or address changes today?: No  All above HIPAA information was verified with patient.  Shipping Address: [address]  [place] [address]   Insurance changes? No    Completed refill call assessment today to schedule patient's medication shipment from the [facility] [phone]).      Confirmed the medication and dosage are correct and have not changed: Yes, regimen is correct and unchanged.    Confirmed patient started or stopped the following medications in the past month:  No, there are no changes reported at this time.    Are you tolerating your medication?:  Tracie Dixon reports tolerating the medication.    ADHERENCE    (Below is required for Medicare Part B or Transplant patients only - per drug):   How many tablets were dispensed last month: N/A  Patient currently has N/A remaining.    Did you miss any doses in the past 4 weeks? No missed doses reported.    FINANCIAL/SHIPPING    Delivery Scheduled: Yes, Expected medication delivery date: 05/08/18     Medication will be delivered via UPS to the home address in [facility].    The patient will receive a drug information handout for each medication shipped and additional FDA Medication Guides as required.      Tracie Dixon did not have any additional questions at this time.    We will follow up with patient monthly for standard refill processing and delivery.      Thank you,  Jasper Loser   [facility] Specialty Technician

## 2018-05-07 MED FILL — ENBREL SURECLICK 50 MG/ML (1 ML) SUBCUTANEOUS PEN INJECTOR: 28 days supply | Qty: 4 | Fill #4

## 2018-05-07 MED FILL — ENBREL SURECLICK 50 MG/ML (1 ML) SUBCUTANEOUS PEN INJECTOR: 28 days supply | Qty: 4 | Fill #4 | Status: AC

## 2018-05-15 ENCOUNTER — Ambulatory Visit
Admission: RE | Admit: 2018-05-15 | Discharge: 2018-05-15 | Disposition: A | Discharge status: Home / Self Care | Payer: BLUE CROSS/BLUE SHIELD | Source: Ambulatory Visit | Attending: Family Medicine | Admitting: Family Medicine

## 2018-05-15 DIAGNOSIS — Z1231 Encounter for screening mammogram for malignant neoplasm of breast: Secondary | ICD-10-CM | POA: Diagnosis not present

## 2018-05-25 ENCOUNTER — Encounter: Payer: Self-pay | Admitting: Maternal Newborn

## 2018-05-25 ENCOUNTER — Ambulatory Visit (INDEPENDENT_AMBULATORY_CARE_PROVIDER_SITE_OTHER): Payer: BLUE CROSS/BLUE SHIELD | Admitting: Maternal Newborn

## 2018-05-25 VITALS — BP 100/60 | Ht 72.0 in | Wt 170.0 lb

## 2018-05-25 DIAGNOSIS — Z01419 Encounter for gynecological examination (general) (routine) without abnormal findings: Secondary | ICD-10-CM | POA: Diagnosis not present

## 2018-05-25 NOTE — Progress Notes (Signed)
Gynecology Annual Exam  PCP: McLean-Scocuzza, Nino Glow, MD  Chief Complaint:  Chief Complaint  Patient presents with  . Gynecologic Exam    History of Present Illness: Patient is a 48 y.o. G2P0020 presenting for an annual exam. The patient has occasional hot flashes. She currently has an upper respiratory infection.   LMP: Patient's last menstrual period was 12/22/2015. Postmenopausal Bleeding: no  The patient is not currently sexually active. She currently uses post menopausal status for contraception.  The patient does perform self breast exams.  There is no notable family history of breast or ovarian cancer in her family.  The patient has regular exercise: yes.    The patient denies current symptoms of depression.    Review of Systems  Constitutional: Negative.   HENT: Positive for sore throat.   Respiratory: Positive for cough. Negative for shortness of breath and wheezing.   Cardiovascular: Negative for chest pain and palpitations.  Gastrointestinal: Negative for abdominal pain.  Genitourinary: Negative.   Musculoskeletal: Negative.   Skin: Negative.   Neurological: Negative.   Endo/Heme/Allergies: Positive for environmental allergies.  Psychiatric/Behavioral: Negative.   All other systems reviewed and are negative.   Past Medical History:  Past Medical History:  Diagnosis Date  . Asthma    exercise induced in college   . Brain aneurysm    noted MRI 2004  . Collagen vascular disease ([place])   . Depression    situational  . DVT (deep vein thrombosis) in pregnancy 12/2015   left calf  . Fibroid   . GERD (gastroesophageal reflux disease)   . Herpes genitalia 1992  . History of Papanicolaou smear of cervix 12/27/11; 04/18/16   -/-; neg  . HSV-2 (herpes simplex virus 2) infection   . Hyperlipidemia   . Lipoma    left shoulder removed   . Ovarian cyst   . Premature ovarian failure 2013   Dr. Cristino Martes, GYN. Symptoms have resolved  . Pulmonary embolism ([place])  11/2015   with DVT per pt provoked on international 8 hr flight and was on OCP  . Rheumatoid arthritis ([place])    rheumatoid dx 2015 f/u rheumatology Dr. Rosalia Hammers [facility] worse hands, feet, L thumb  . TMJ (dislocation of temporomandibular joint)     Past Surgical History:  Past Surgical History:  Procedure Laterality Date  . APPENDECTOMY  1997  . COLONOSCOPY WITH PROPOFOL N/A 12/24/2017   Procedure: COLONOSCOPY WITH PROPOFOL;  Surgeon: Virgel Manifold, MD;  Location: [facility] ENDOSCOPY;  Service: Endoscopy;  Laterality: N/A;  . INDUCED ABORTION  05/20/1990; 05/21/1995   x2  . KNEE SURGERY Bilateral    x2 each knee  . LIPOMA EXCISION  11/2015   back  . WISDOM TOOTH EXTRACTION      Gynecologic History:  Patient's last menstrual period was 12/22/2015. Contraception: post menopausal status Last Pap: 05/06/2017. Results were:  NIL and HR HPV negative  Last mammogram: 05/15/2018. Results were: BI-RADS I Obstetric History: G2P0020  Family History:  Family History  Problem Relation Age of Onset  . Cancer Father        prostate cancer died 3  . Hyperlipidemia Father   . Hypertension Father   . Diabetes Father   . Heart disease Father        Died of HF  . Hyperlipidemia Brother   . Heart disease Maternal Grandmother        CAD - died  . Early death Mother 62       Died  of complication from double pna  . Bronchitis Brother        not smoker   . Cancer Paternal Grandfather        prostate  . Breast cancer Neg Hx     Social History:  Social History   Socioeconomic History  . Marital status: Divorced    Spouse name: Not on file  . Number of children: 0  . Years of education: 64  . Highest education level: Not on file  Occupational History  . Occupation: Personnel officer: [employer]  Social Needs  . Financial resource strain: Not on file  . Food insecurity:    Worry: Not on file    Inability: Not on file  . Transportation needs:    Medical: Not  on file    Non-medical: Not on file  Tobacco Use  . Smoking status: Never Smoker  . Smokeless tobacco: Never Used  Substance and Sexual Activity  . Alcohol use: No  . Drug use: No  . Sexual activity: Not Currently    Birth control/protection: Other-see comments    Comment: perimenopausal  Lifestyle  . Physical activity:    Days per week: Not on file    Minutes per session: Not on file  . Stress: Not on file  Relationships  . Social connections:    Talks on phone: Not on file    Gets together: Not on file    Attends religious service: Not on file    Active member of club or organization: Not on file    Attends meetings of clubs or organizations: Not on file    Relationship status: Not on file  . Intimate partner violence:    Fear of current or ex partner: Not on file    Emotionally abused: Not on file    Physically abused: Not on file    Forced sexual activity: Not on file  Other Topics Concern  . Not on file  Social History Narrative   Myrtie grew up in [place], [place]. She attended [employer] and obtained her Bachelor's Degree in Sociology. She also played on the [employer] [phone]. She is currently the [name] at [employer]. She is divorced. Trent lives in [place], [place] with her dog, Cece. 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 She also enjoys decorating. 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    Played in [employer]     Allergies:  Allergies  Allergen Reactions  . Molds & Smuts     Congestion in the nose and chest    Medications: Prior to Admission medications   Medication Sig Start Date End Date Taking? Authorizing Provider  etanercept (ENBREL) 50 MG/ML injection Inject 50 mg into the skin. 04/22/17   [provider]  folic acid (FOLVITE) 1 MG tablet Take 1 tablet by mouth daily. 03/13/18   [provider]  methotrexate (RHEUMATREX) 2.5 MG tablet  Take 8 tablets by mouth once a week. 12/16/17   [provider]  mupirocin ointment (BACTROBAN) 2 % Apply 1 application topically 2 (two) times daily. Left ear bid x 1 week 07/02/17   McLean-Scocuzza, Nino Glow, MD  valACYclovir (VALTREX) 1000 MG tablet TAKE 1 TABLET BY MOUTH DAILY Patient taking differently: TAKE 1 TABLET BY MOUTH DAILY prn outbreak 06/30/17   Paulina Fusi, MD    Physical Exam Vitals: Last menstrual period 12/22/2015.  General:  NAD HEENT: normocephalic, anicteric Thyroid: no enlargement, no palpable nodules Pulmonary: No increased work of breathing, CTAB Cardiovascular: RRR, no murmurs, rubs, or gallops Breast: Breasts symmetrical, no tenderness, no palpable nodules or masses, no skin or nipple retraction present, no nipple discharge.  No axillary or supraclavicular lymphadenopathy. Abdomen: Soft, non-tender, non-distended.  Umbilicus without lesions.  No hepatomegaly, splenomegaly or masses palpable. No evidence of hernia  Genitourinary: Deferred after shared decision-making. Low risk, asymptomatic and no samples needed today. Extremities: no edema, erythema, or tenderness Neurologic: Grossly intact Psychiatric: mood appropriate, affect full  Assessment: 48 y.o. G2P0020 here for an annual exam  Plan: Problem List Items Addressed This Visit    None    Visit Diagnoses    Women's annual routine gynecological examination    -  Primary      1) Mammogram - recommend yearly screening mammogram.  Mammogram is up to date: normal on 05/15/2018.  2) STI screening was offered and declined.  3) ASCCP guidelines and rationale discussed.  Patient opts for every 3 years screening interval.  4) Contraception - She is postmenopausal.  5) Colonoscopy -- She had a colonoscopy in 2019  6) Routine healthcare maintenance including cholesterol, diabetes screening: managed by PCP.  7) Return in 1 year for an annual exam.  Avel Sensor, CNM 05/26/2018

## 2018-05-28 NOTE — Unmapped (Signed)
[facility] Refill Coordination Note    Specialty Medication(s) to be Shipped:   Inflammatory Disorders: Enbrel    Other medication(s) to be shipped: n/a     Tracie Dixon, DOB: 1971/01/04  Phone: [phone] (home)       All above HIPAA information was verified with patient.     Completed refill call assessment today to schedule patient's medication shipment from the [facility] [phone]).       Specialty medication(s) and dose(s) confirmed: Regimen is correct and unchanged.   Changes to medications: Rayn reports no changes reported at this time.  Changes to insurance: No  Questions for the pharmacist: No    Confirmed patient received Welcome Packet with first shipment. The patient will receive a drug information handout for each medication shipped and additional FDA Medication Guides as required.       DISEASE/MEDICATION-SPECIFIC INFORMATION        she has one on hand at this time of the enbrel    SPECIALTY MEDICATION ADHERENCE     Medication Adherence    Patient reported X missed doses in the last month:  0  Specialty Medication:  enbrel  Patient is on additional specialty medications:  No  Patient is on more than two specialty medications:  No  Any gaps in refill history greater than 2 weeks in the last 3 months:  no  Demonstrates understanding of importance of adherence:  yes  Informant:  patient  Reliability of informant:  reliable              Confirmed plan for next specialty medication refill:  delivery by pharmacy  Refills needed for supportive medications:  not needed          Refill Coordination    Has the Patients' Contact Information Changed:  No  Is the Shipping Address Different:  No           enbrel sureclick 50mg /ml: patient has 7 days of medication on hand      SHIPPING     Shipping address confirmed in Epic.     Delivery Scheduled: Yes, Expected medication delivery date: 1/14.     Medication will be delivered via UPS to the home address in Epic [place]. Renette Butters   [facility] Specialty Technician

## 2018-06-01 MED ORDER — METHOTREXATE SODIUM 2.5 MG TABLET
ORAL_TABLET | ORAL | 1 refills | 0 days | Status: CP
Start: 2018-06-01 — End: 2018-12-15

## 2018-06-01 NOTE — Unmapped (Signed)
Methotrexate refill  Last ov: 03/24/2018  Next ov: 07/23/2018   Labs:   AST   Date Value Ref Range Status   03/24/2018 28 17 - 47 U/L Final     ALT   Date Value Ref Range Status   03/24/2018 17 <35 U/L Final     Creatinine   Date Value Ref Range Status   12/16/2017 0.78 0.60 - 1.00 mg/dL Final     WBC   Date Value Ref Range Status   03/24/2018 5.4 4.5 - 11.0 10*9/L Final     HGB   Date Value Ref Range Status   03/24/2018 13.0 12.0 - 16.0 g/dL Final     HCT   Date Value Ref Range Status   03/24/2018 40.8 36.0 - 46.0 % Final     MCV   Date Value Ref Range Status   03/24/2018 81.9 80.0 - 100.0 fL Final     RDW   Date Value Ref Range Status   03/24/2018 15.3 (H) 12.0 - 15.0 % Final     Platelet   Date Value Ref Range Status   03/24/2018 169 150 - 440 10*9/L Final     Neutrophils %   Date Value Ref Range Status   03/24/2018 57.1 % Final     Lymphocytes %   Date Value Ref Range Status   03/24/2018 32.8 % Final     Monocytes %   Date Value Ref Range Status   03/24/2018 5.4 % Final     Eosinophils %   Date Value Ref Range Status   03/24/2018 2.9 % Final     Basophils %   Date Value Ref Range Status   03/24/2018 0.5 % Final

## 2018-06-02 MED ORDER — ETANERCEPT 50 MG/ML (1 ML) SUBCUTANEOUS PEN INJECTOR
6 refills | 0 days | Status: CP
Start: 2018-06-02 — End: 2019-06-02

## 2018-06-02 NOTE — Unmapped (Signed)
[facility]HE[facility] CLINIC - PHARMACIST NOTES    Ms. Tracie Dixon prescription for Enbrel has been forwarded to [facility] as per insurance requirement.   Patient was informed by the [facility] of the change and provided phone # to contact [facility].    Will disenroll patient from the [facility] specialty call list for refill and clinical assessment of Enbrel.      Chelsea Aus

## 2018-07-23 ENCOUNTER — Encounter: Admit: 2018-07-23 | Discharge: 2018-07-24 | Payer: PRIVATE HEALTH INSURANCE

## 2018-07-23 DIAGNOSIS — M2012 Hallux valgus (acquired), left foot: Principal | ICD-10-CM

## 2018-07-23 DIAGNOSIS — Z86711 Personal history of pulmonary embolism: Principal | ICD-10-CM

## 2018-07-23 DIAGNOSIS — M059 Rheumatoid arthritis with rheumatoid factor, unspecified: Principal | ICD-10-CM

## 2018-07-23 DIAGNOSIS — Z79899 Other long term (current) drug therapy: Principal | ICD-10-CM

## 2018-07-23 DIAGNOSIS — M2011 Hallux valgus (acquired), right foot: Principal | ICD-10-CM

## 2018-07-23 DIAGNOSIS — Z86718 Personal history of other venous thrombosis and embolism: Principal | ICD-10-CM

## 2018-07-23 DIAGNOSIS — I2699 Other pulmonary embolism without acute cor pulmonale: Principal | ICD-10-CM

## 2018-07-23 DIAGNOSIS — I82409 Acute embolism and thrombosis of unspecified deep veins of unspecified lower extremity: Principal | ICD-10-CM

## 2018-07-23 DIAGNOSIS — M069 Rheumatoid arthritis, unspecified: Principal | ICD-10-CM

## 2018-07-23 MED ORDER — FOLIC ACID 1 MG TABLET
ORAL_TABLET | Freq: Every day | ORAL | 3 refills | 0 days | Status: CP
Start: 2018-07-23 — End: ?

## 2018-09-21 ENCOUNTER — Other Ambulatory Visit: Payer: Self-pay

## 2018-09-21 ENCOUNTER — Telehealth: Payer: BLUE CROSS/BLUE SHIELD | Admitting: Nurse Practitioner

## 2018-09-21 DIAGNOSIS — M545 Low back pain, unspecified: Secondary | ICD-10-CM

## 2018-09-21 MED ORDER — TIZANIDINE HCL 4 MG PO TABS: 4.0000 mg | ORAL_TABLET | Freq: Two times a day (BID) | ORAL | 0 refills | Status: DC | PRN

## 2018-09-21 MED ORDER — ACETAMINOPHEN 500 MG PO TABS: 500.0000 mg | ORAL_TABLET | Freq: Three times a day (TID) | ORAL | 0 refills | Status: AC | PRN

## 2018-09-21 NOTE — Patient Instructions (Addendum)
-Please continue your TENS machine, start ice/heat rotating and stretching when you can tolerate it -Please take all meds as directed -Walking is encouraged as tolerated to help keep joints/muscles from stiffening up -If your back pain does not significantly improve or resolve in the next 48-72 hours please call your rheumatologist as this may be a flare up. Patient verbalized understanding of all instructions given/reviewed and has no further questions or concerns at this time.     Acute Back Pain, Adult Acute back pain is sudden and usually short-lived. It is often caused by an injury to the muscles and tissues in the back. The injury may result from:  A muscle or ligament getting overstretched or torn (strained). Ligaments are tissues that connect bones to each other. Lifting something improperly can cause a back strain.  Wear and tear (degeneration) of the spinal disks. Spinal disks are circular tissue that provides cushioning between the bones of the spine (vertebrae).  Twisting motions, such as while playing sports or doing yard work.  A hit to the back.  Arthritis. You may have a physical exam, lab tests, and imaging tests to find the cause of your pain. Acute back pain usually goes away with rest and home care. Follow these instructions at home: Managing pain, stiffness, and swelling  Take over-the-counter and prescription medicines only as told by your health care provider.  Your health care provider may recommend applying ice during the first 24-48 hours after your pain starts. To do this: ? Put ice in a plastic bag. ? Place a towel between your skin and the bag. ? Leave the ice on for 20 minutes, 2-3 times a day.  If directed, apply heat to the affected area as often as told by your health care provider. Use the heat source that your health care provider recommends, such as a moist heat pack or a heating pad. ? Place a towel between your skin and the heat source. ? Leave the  heat on for 20-30 minutes. ? Remove the heat if your skin turns bright red. This is especially important if you are unable to feel pain, heat, or cold. You have a greater risk of getting burned. Activity    Do not stay in bed. Staying in bed for more than 1-2 days can delay your recovery.  Sit up and stand up straight. Avoid leaning forward when you sit, or hunching over when you stand. ? If you work at a desk, sit close to it so you do not need to lean over. Keep your chin tucked in. Keep your neck drawn back, and keep your elbows bent at a right angle. Your arms should look like the letter "L." ? Sit high and close to the steering wheel when you drive. Add lower back (lumbar) support to your car seat, if needed.  Take short walks on even surfaces as soon as you are able. Try to increase the length of time you walk each day.  Do not sit, drive, or stand in one place for more than 30 minutes at a time. Sitting or standing for long periods of time can put stress on your back.  Do not drive or use heavy machinery while taking prescription pain medicine.  Use proper lifting techniques. When you bend and lift, use positions that put less stress on your back: ? [place] your knees. ? Keep the load close to your body. ? Avoid twisting.  Exercise regularly as told by your health care provider. Exercising helps  your back heal faster and helps prevent back injuries by keeping muscles strong and flexible.  Work with a physical therapist to make a safe exercise program, as recommended by your health care provider. Do any exercises as told by your physical therapist. Lifestyle  Maintain a healthy weight. Extra weight puts stress on your back and makes it difficult to have good posture.  Avoid activities or situations that make you feel anxious or stressed. Stress and anxiety increase muscle tension and can make back pain worse. Learn ways to manage anxiety and stress, such as through  exercise. General instructions  Sleep on a firm mattress in a comfortable position. Try lying on your side with your knees slightly bent. If you lie on your back, put a pillow under your knees.  Follow your treatment plan as told by your health care provider. This may include: ? Cognitive or behavioral therapy. ? Acupuncture or massage therapy. ? Meditation or yoga. Contact a health care provider if:  You have pain that is not relieved with rest or medicine.  You have increasing pain going down into your legs or buttocks.  Your pain does not improve after 2 weeks.  You have pain at night.  You lose weight without trying.  You have a fever or chills. Get help right away if:  You develop new bowel or bladder control problems.  You have unusual weakness or numbness in your arms or legs.  You develop nausea or vomiting.  You develop abdominal pain.  You feel faint. Summary  Acute back pain is sudden and usually short-lived.  Use proper lifting techniques. When you bend and lift, use positions that put less stress on your back.  Take over-the-counter and prescription medicines and apply heat or ice as directed by your health care provider. This information is not intended to replace advice given to you by your health care provider. Make sure you discuss any questions you have with your health care provider. Document Released: 05/06/2005 Document Revised: 12/11/2017 Document Reviewed: 12/18/2016 Elsevier Interactive Patient Education  2019 [employer].

## 2018-09-21 NOTE — Progress Notes (Signed)
   Subjective:    Patient ID: Vanessa Torres, female    DOB: May 05, 1971, 48 y.o.   MRN: [id number]  HPI Gabrielly calls the office with c/o low back pain. She reports this started about a week and a half ago with no known injury. She does not think this is a rheumatoid flare up as she reports her symptoms are usually in her hands and feet and this isn't occurring. She rates pain to the lest lower back that is sore and a 7/10 that is getting worse. She does report a history of back pain before. She states that her activity is being limited because of this pain. Denies fever, cough, SOB, recent travel or any other symptoms.   Review of Systems  Constitutional: Negative for fever.  Respiratory: Negative for cough and shortness of breath.   Musculoskeletal: Positive for back pain.  Skin: Negative for rash.       Objective:   Physical Exam Answers questions appropriately       Assessment & Plan:

## 2018-11-17 ENCOUNTER — Other Ambulatory Visit: Payer: Self-pay | Admitting: Family Medicine

## 2018-11-17 ENCOUNTER — Ambulatory Visit
Admission: RE | Admit: 2018-11-17 | Discharge: 2018-11-17 | Disposition: A | Discharge status: Home / Self Care | Payer: BC Managed Care – PPO | Source: Ambulatory Visit | Attending: Family Medicine | Admitting: Family Medicine

## 2018-11-17 ENCOUNTER — Ambulatory Visit
Admission: RE | Admit: 2018-11-17 | Discharge: 2018-11-17 | Disposition: A | Discharge status: Home / Self Care | Payer: BC Managed Care – PPO | Attending: Family Medicine | Admitting: Family Medicine

## 2018-11-17 ENCOUNTER — Other Ambulatory Visit: Payer: Self-pay

## 2018-11-17 DIAGNOSIS — G8929 Other chronic pain: Secondary | ICD-10-CM | POA: Insufficient documentation

## 2018-11-17 DIAGNOSIS — M25561 Pain in right knee: Secondary | ICD-10-CM | POA: Diagnosis not present

## 2018-11-20 ENCOUNTER — Other Ambulatory Visit: Payer: Self-pay

## 2018-11-20 DIAGNOSIS — Z20822 Contact with and (suspected) exposure to covid-19: Secondary | ICD-10-CM

## 2018-11-23 ENCOUNTER — Other Ambulatory Visit: Payer: Self-pay

## 2018-11-23 DIAGNOSIS — Z20822 Contact with and (suspected) exposure to covid-19: Secondary | ICD-10-CM

## 2018-11-28 LAB — NOVEL CORONAVIRUS, NAA: SARS-CoV-2, NAA: NOT DETECTED

## 2018-11-30 ENCOUNTER — Other Ambulatory Visit: Payer: Self-pay | Admitting: Family Medicine

## 2018-11-30 DIAGNOSIS — G8929 Other chronic pain: Secondary | ICD-10-CM

## 2018-12-01 ENCOUNTER — Encounter
Admit: 2018-12-01 | Discharge: 2018-12-02 | Payer: PRIVATE HEALTH INSURANCE | Attending: Rheumatology | Primary: Rheumatology

## 2018-12-01 MED ORDER — VALACYCLOVIR 1 GRAM TABLET
ORAL_TABLET | Freq: Every day | ORAL | 1 refills | 12.00000 days | Status: CP | PRN
Start: 2018-12-01 — End: ?

## 2018-12-11 IMAGING — MG MM DIGITAL SCREENING BILAT W/ TOMO W/ CAD
9 of 12 series · 9 of 28 positions shown · non-contrast
Comparison: Previous exam(s).

CLINICAL DATA: Screening.

EXAM:
2D DIGITAL SCREENING BILATERAL MAMMOGRAM WITH CAD AND ADJUNCT TOMO

[R MLO synth-2D]
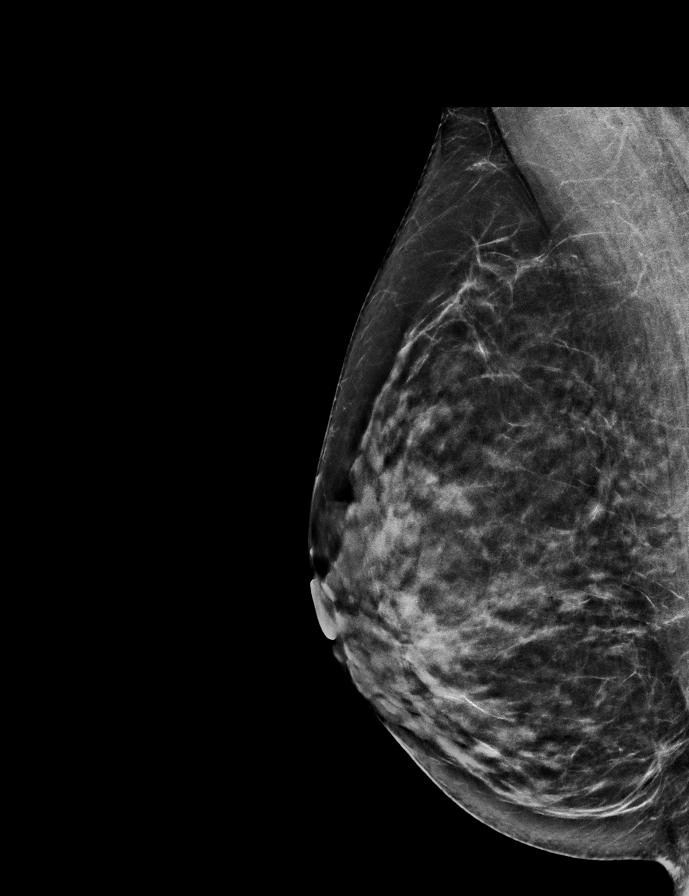

[R MLO]
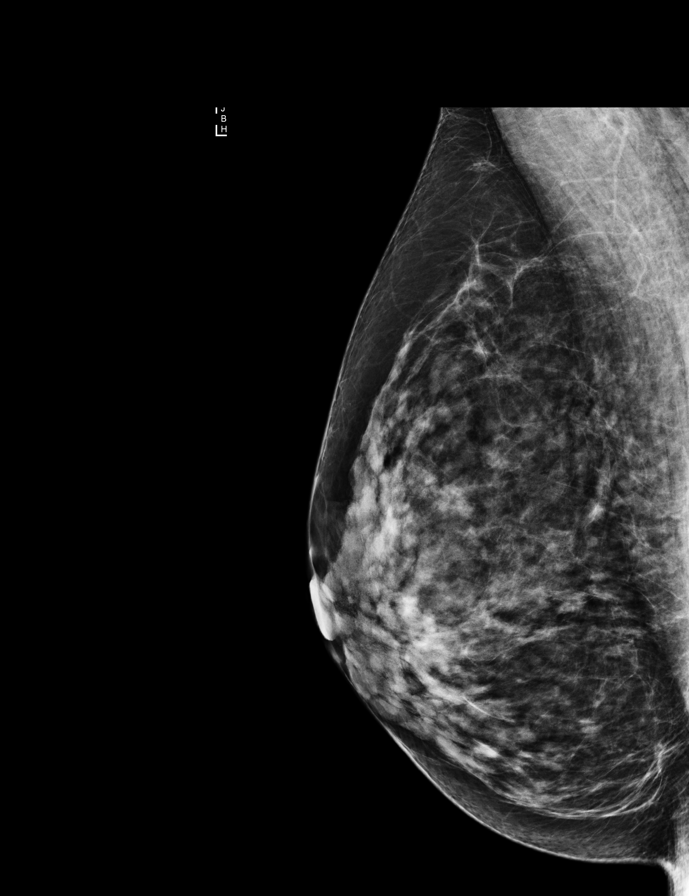

[R CC]
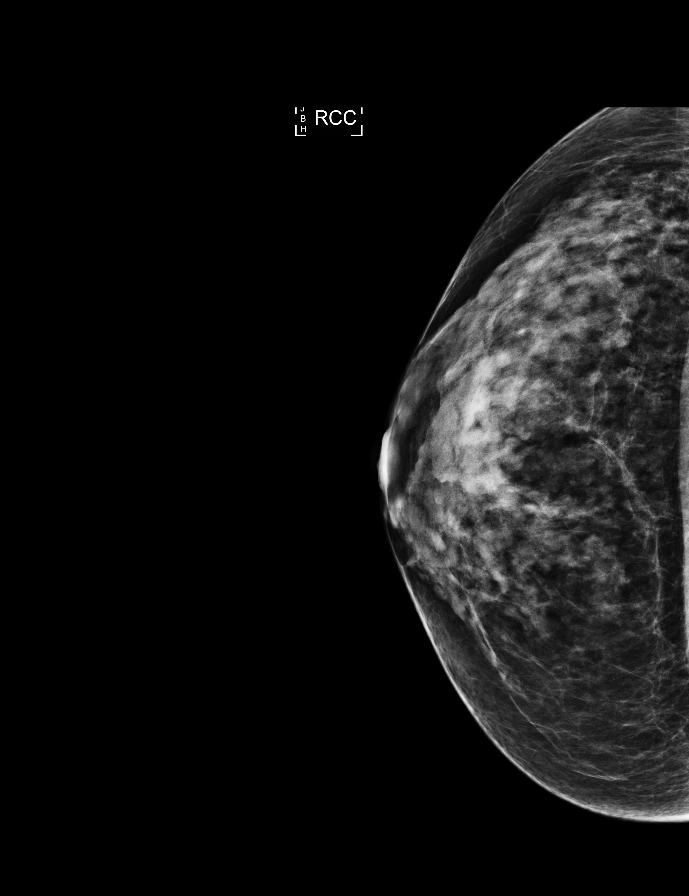

[L MLO synth-2D]
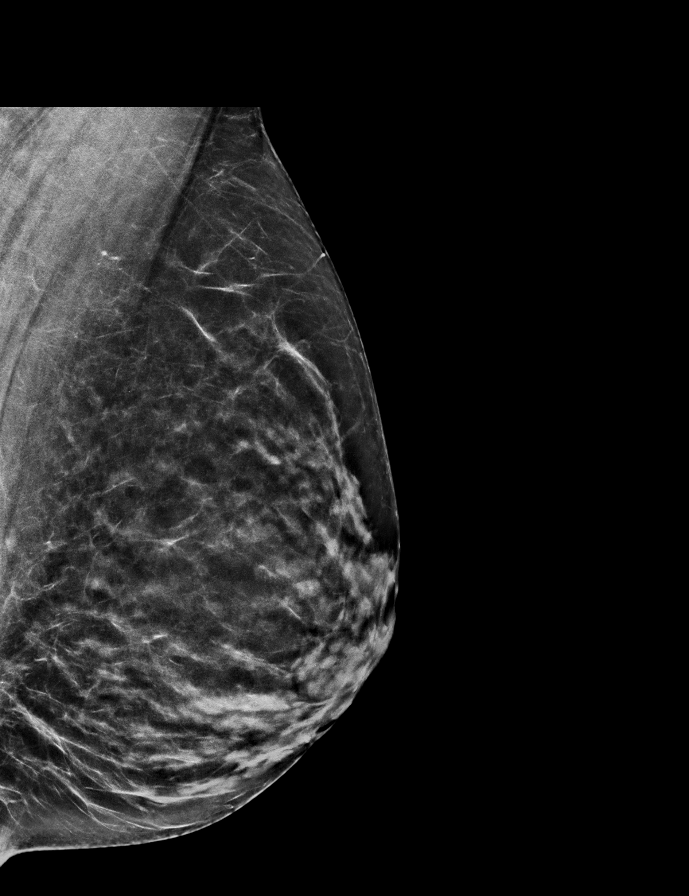

[L MLO]
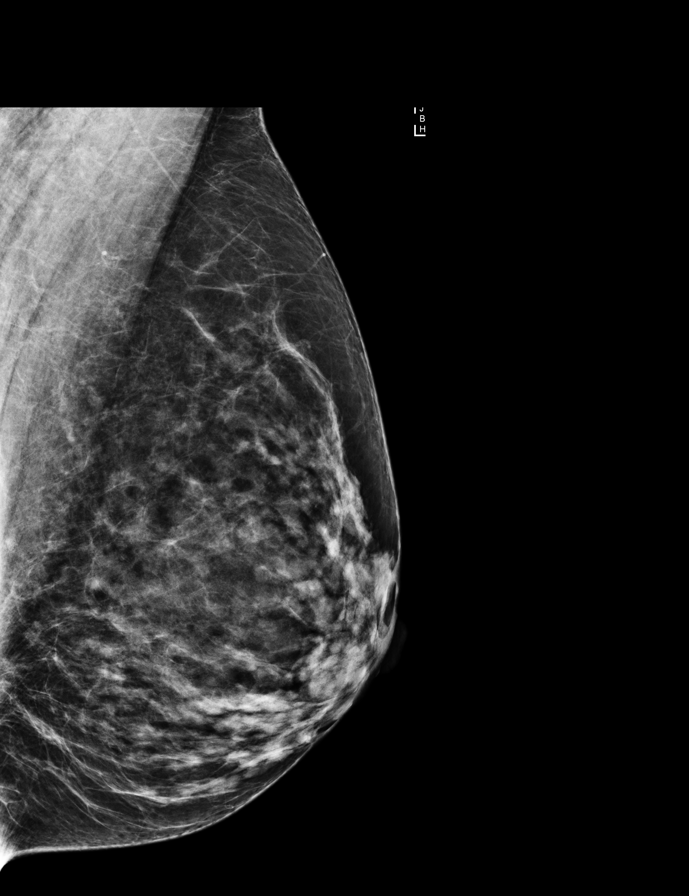

[L CC]
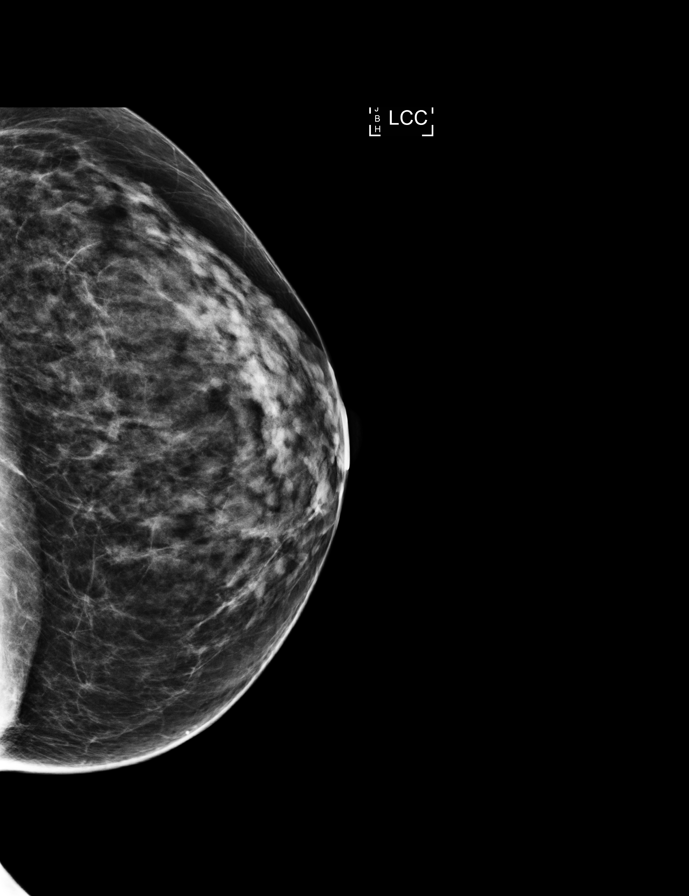

[R CC synth-2D]
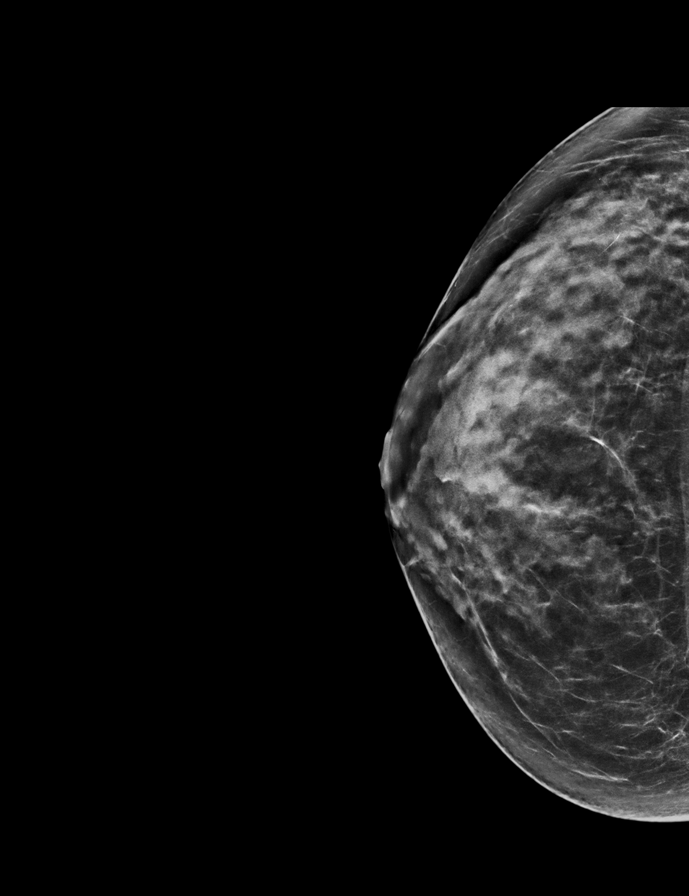

[L CC synth-2D]
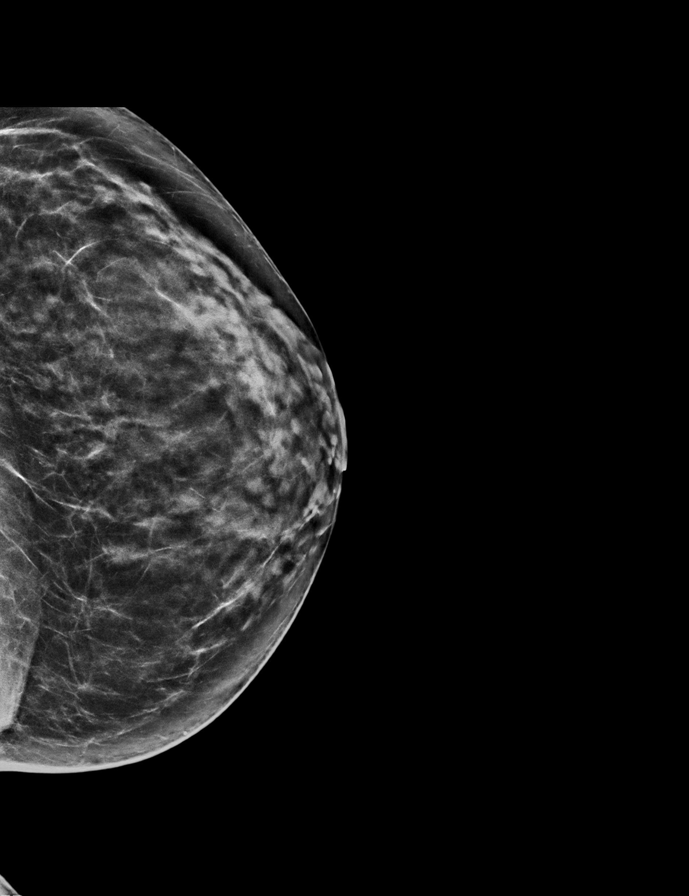

[R CC tomo · tomo slice 37/72.0]
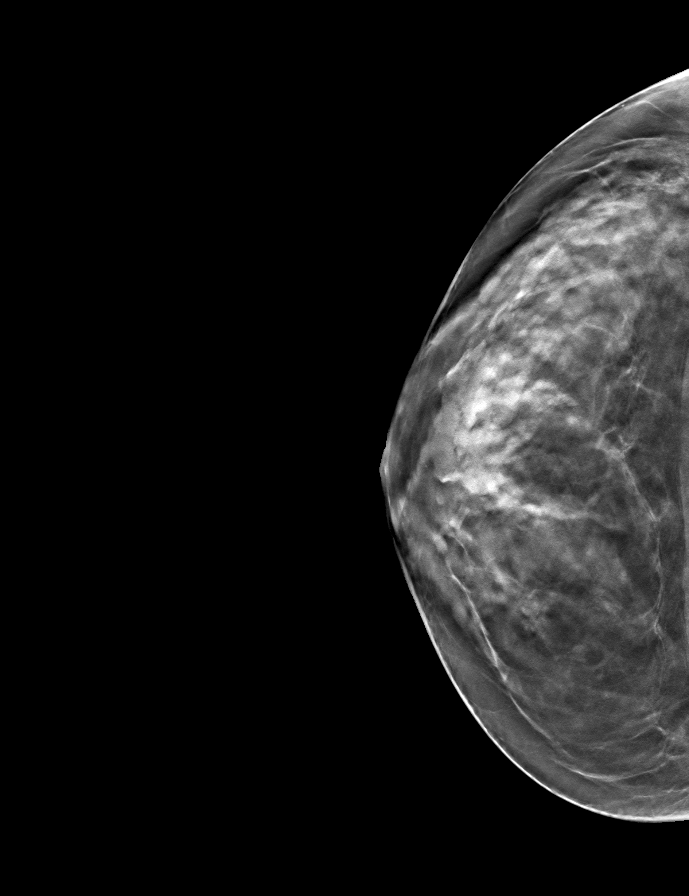

[9 of 28 positions shown; findings below may reference images not displayed]

ACR Breast Density Category c: The breast tissue is heterogeneously
dense, which may obscure small masses.
FINDINGS: There are no findings suspicious for malignancy. Images were
processed with CAD.
IMPRESSION: No mammographic evidence of malignancy. A result letter of this
screening mammogram will be mailed directly to the patient.

RECOMMENDATION:
Screening mammogram in one year. (Code:[id number])

BI-RADS CATEGORY  1: Negative.

## 2018-12-15 ENCOUNTER — Other Ambulatory Visit: Payer: Self-pay

## 2018-12-15 ENCOUNTER — Emergency Department
Admission: EM | Admit: 2018-12-15 | Discharge: 2018-12-15 | Disposition: A | Discharge status: Home / Self Care | Payer: BC Managed Care – PPO | Attending: Emergency Medicine | Admitting: Emergency Medicine

## 2018-12-15 ENCOUNTER — Encounter: Payer: Self-pay | Admitting: Emergency Medicine

## 2018-12-15 DIAGNOSIS — Z79899 Other long term (current) drug therapy: Secondary | ICD-10-CM | POA: Insufficient documentation

## 2018-12-15 DIAGNOSIS — R112 Nausea with vomiting, unspecified: Secondary | ICD-10-CM | POA: Insufficient documentation

## 2018-12-15 DIAGNOSIS — R1010 Upper abdominal pain, unspecified: Secondary | ICD-10-CM | POA: Diagnosis present

## 2018-12-15 LAB — CBC WITH DIFFERENTIAL/PLATELET
Abs Immature Granulocytes: 0.04 10*3/uL (ref 0.00–0.07)
Basophils Absolute: 0 10*3/uL (ref 0.0–0.1)
Basophils Relative: 0 %
Eosinophils Absolute: 0.1 10*3/uL (ref 0.0–0.5)
Eosinophils Relative: 1 %
HCT: 38.3 % (ref 36.0–46.0)
Hemoglobin: 12.5 g/dL (ref 12.0–15.0)
Immature Granulocytes: 1 %
Lymphocytes Relative: 21 %
Lymphs Abs: 1.5 10*3/uL (ref 0.7–4.0)
MCH: 26.4 pg (ref 26.0–34.0)
MCHC: 32.6 g/dL (ref 30.0–36.0)
MCV: 81 fL (ref 80.0–100.0)
Monocytes Absolute: 0.5 10*3/uL (ref 0.1–1.0)
Monocytes Relative: 6 %
Neutro Abs: 5.1 10*3/uL (ref 1.7–7.7)
Neutrophils Relative %: 71 %
Platelets: 234 10*3/uL (ref 150–400)
RBC: 4.73 MIL/uL (ref 3.87–5.11)
RDW: 14 % (ref 11.5–15.5)
WBC: 7.2 10*3/uL (ref 4.0–10.5)
nRBC: 0 % (ref 0.0–0.2)

## 2018-12-15 LAB — COMPREHENSIVE METABOLIC PANEL
ALT: 17 U/L (ref 0–44)
AST: 25 U/L (ref 15–41)
Albumin: 4.1 g/dL (ref 3.5–5.0)
Alkaline Phosphatase: 54 U/L (ref 38–126)
Anion gap: 7 (ref 5–15)
BUN: 13 mg/dL (ref 6–20)
CO2: 24 mmol/L (ref 22–32)
Calcium: 9.6 mg/dL (ref 8.9–10.3)
Chloride: 109 mmol/L (ref 98–111)
Creatinine, Ser: 0.66 mg/dL (ref 0.44–1.00)
GFR calc Af Amer: >60 mL/min (ref >60)
GFR calc non Af Amer: >60 mL/min (ref >60)
Glucose, Bld: 97 mg/dL (ref 70–99)
Potassium: 3.8 mmol/L (ref 3.5–5.1)
Sodium: 140 mmol/L (ref 135–145)
Total Bilirubin: 0.7 mg/dL (ref 0.3–1.2)
Total Protein: 6.9 g/dL (ref 6.5–8.1)

## 2018-12-15 LAB — URINALYSIS, COMPLETE (UACMP) WITH MICROSCOPIC
Bacteria, UA: NONE SEEN
Bilirubin Urine: NEGATIVE
Glucose, UA: NEGATIVE mg/dL
Hgb urine dipstick: NEGATIVE
Ketones, ur: NEGATIVE mg/dL
Leukocytes,Ua: NEGATIVE
Nitrite: NEGATIVE
Protein, ur: NEGATIVE mg/dL
Specific Gravity, Urine: 1.017 (ref 1.005–1.030)
pH: 5 (ref 5.0–8.0)

## 2018-12-15 LAB — POCT PREGNANCY, URINE: Preg Test, Ur: NEGATIVE

## 2018-12-15 LAB — LIPASE, BLOOD: Lipase: 49 U/L (ref 11–51)

## 2018-12-15 MED ORDER — METHOTREXATE SODIUM 2.5 MG TABLET
ORAL_TABLET | 2 refills | 0 days | Status: CP
Start: 2018-12-15 — End: ?

## 2018-12-15 MED ORDER — ONDANSETRON 4 MG PO TBDP: 4.0000 mg | ORAL_TABLET | Freq: Three times a day (TID) | ORAL | 0 refills | Status: DC | PRN

## 2018-12-15 MED ORDER — SODIUM CHLORIDE 0.9 % IV BOLUS
1000.0000 mL | Freq: Once | INTRAVENOUS | Status: AC
  Administered 2018-12-15: 1000 mL via INTRAVENOUS

## 2018-12-15 MED ORDER — ONDANSETRON HCL 4 MG/2ML IJ SOLN
4.0000 mg | Freq: Once | INTRAMUSCULAR | Status: AC
  Administered 2018-12-15: 4 mg via INTRAVENOUS
  Filled 2018-12-15: qty 2

## 2018-12-15 NOTE — ED Triage Notes (Signed)
Cramping pain upper mid abd for about 45 minutes.

## 2018-12-15 NOTE — ED Provider Notes (Signed)
[facility] Emergency Department Provider Note  Time seen: 10:20 AM  I have reviewed the triage vital signs and the nursing notes.   HISTORY  Chief Complaint Abdominal Pain   HPI Vanessa Torres is a 48 y.o. female with a past medical history of asthma, depression, gastric reflux, presents to the emergency department for abdominal pain.  According to the patient  this morning she developed upper abdominal pain, along with nausea an episode of vomiting.  Patient came to the emergency department for further evaluation.  Denies any fever cough or shortness of breath.  Denies any dysuria or hematuria.  Patient states mild upper abdominal pain currently.  Past Medical History:  Diagnosis Date  . Asthma    exercise induced in college   . Brain aneurysm    noted MRI 2004  . Collagen vascular disease ([place])   . Depression    situational  . DVT (deep vein thrombosis) in pregnancy 12/2015   left calf  . Fibroid   . GERD (gastroesophageal reflux disease)   . Herpes genitalia 1992  . History of Papanicolaou smear of cervix 12/27/11; 04/18/16   -/-; neg  . HSV-2 (herpes simplex virus 2) infection   . Hyperlipidemia   . Lipoma    left shoulder removed   . Ovarian cyst   . Premature ovarian failure 2013   Dr. Cristino Martes, GYN. Symptoms have resolved  . Pulmonary embolism ([place]) 11/2015   with DVT per pt provoked on international 8 hr flight and was on OCP  . Rheumatoid arthritis ([place])    rheumatoid dx 2015 f/u rheumatology Dr. Rosalia Hammers [facility] worse hands, feet, L thumb  . TMJ (dislocation of temporomandibular joint)     Patient Active Problem List   Diagnosis Date Noted  . Microcytic anemia 12/29/2017  . Special screening for malignant neoplasms, colon   . Benign neoplasm of ascending colon   . Eczema 07/02/2017  . 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 Vitamin D deficiency 07/02/2017  . Dislocation of jaw 07/02/2017  . Rheumatoid arthritis ([place]) 11/07/2016  .  Bilateral pulmonary embolism ([place]) 03/17/2016  . Elevated rheumatoid factor 06/17/2013  . GERD (gastroesophageal reflux disease) 06/17/2013  . Fatigue 06/17/2013  . Lipoma 06/17/2013  . Breast cancer screening 06/17/2013    Past Surgical History:  Procedure Laterality Date  . APPENDECTOMY  1997  . COLONOSCOPY WITH PROPOFOL N/A 12/24/2017   Procedure: COLONOSCOPY WITH PROPOFOL;  Surgeon: Virgel Manifold, MD;  Location: [facility] ENDOSCOPY;  Service: Endoscopy;  Laterality: N/A;  . INDUCED ABORTION  05/20/1990; 05/21/1995   x2  . KNEE SURGERY Bilateral    x2 each knee  . LIPOMA EXCISION  11/2015   back  . WISDOM TOOTH EXTRACTION      Prior to Admission medications   Medication Sig Start Date End Date Taking? Authorizing Provider  acetaminophen (TYLENOL) 500 MG tablet Take 1 tablet (500 mg total) by mouth every 8 (eight) hours as needed (pain). (not to exceed 8 tabs/day) 09/21/18   Maury Dus, NP  etanercept (ENBREL) 50 MG/ML injection Inject 50 mg into the skin. 04/22/17   [provider]  folic acid (FOLVITE) 1 MG tablet Take 1 tablet by mouth daily. 03/13/18   [provider]  methotrexate (RHEUMATREX) 2.5 MG tablet Take 8 tablets by mouth once a week. 12/16/17   [provider]  mupirocin ointment (BACTROBAN) 2 % Apply 1 application topically 2 (two) times daily. Left ear bid x 1 week Patient  not taking: Reported on 05/25/2018 07/02/17   McLean-Scocuzza, Nino Glow, MD  tiZANidine (ZANAFLEX) 4 MG tablet Take 1 tablet (4 mg total) by mouth every 12 (twelve) hours as needed for muscle spasms. MAY CAUSE DROWSINESS 09/21/18   Maury Dus, NP  valACYclovir (VALTREX) 1000 MG tablet TAKE 1 TABLET BY MOUTH DAILY Patient taking differently: TAKE 1 TABLET BY MOUTH DAILY prn outbreak 06/30/17   Paulina Fusi, MD    Allergies  Allergen Reactions  . Molds & Smuts     Congestion in the nose and chest    Family History  Problem Relation Age of Onset  . Cancer  Father        prostate cancer died 31  . Hyperlipidemia Father   . Hypertension Father   . Diabetes Father   . Heart disease Father        Died of HF  . Hyperlipidemia Brother   . Heart disease Maternal Grandmother        CAD - died  . Early death Mother 6       Died of complication from double pna  . Bronchitis Brother        not smoker   . Cancer Paternal Grandfather        prostate  . Breast cancer Neg Hx     Social History Social History   Tobacco Use  . Smoking status: Never Smoker  . Smokeless tobacco: Never Used  Substance Use Topics  . Alcohol use: No  . Drug use: No    Review of Systems Constitutional: Negative for fever. Cardiovascular: Negative for chest pain. Respiratory: Negative for shortness of breath. Gastrointestinal: Positive for upper abdominal pain.  Positive for nausea vomiting. Genitourinary: Negative for urinary compaints Musculoskeletal: Negative for musculoskeletal complaints Skin: Negative for skin complaints  Neurological: Negative for headache All other ROS negative  ____________________________________________   PHYSICAL EXAM:  VITAL SIGNS: ED Triage Vitals  Enc Vitals Group     BP 12/15/18 0930 109/69     Pulse Rate 12/15/18 0930 91     Resp 12/15/18 0930 20     Temp 12/15/18 0930 99.4 F (37.4 C)     Temp Source 12/15/18 0930 Oral     SpO2 12/15/18 0930 98 %     Weight 12/15/18 0931 160 lb (72.6 kg)     Height 12/15/18 0931 6' (1.829 m)     Head Circumference --      Peak Flow --      Pain Score --      Pain Loc --      Pain Edu? --      Excl. in [place]? --    Constitutional: Alert and oriented. Well appearing and in no distress. Eyes: Normal exam ENT      Head: Normocephalic and atraumatic.      Mouth/Throat: Mucous membranes are moist. Cardiovascular: Normal rate, regular rhythm.  Respiratory: Normal respiratory effort without tachypnea nor retractions. Breath sounds are clear Gastrointestinal: Soft, mild upper  abdominal tenderness palpation.  No rebound guarding or distention. Musculoskeletal: Nontender with normal range of motion in all extremities.  Neurologic:  Normal speech and language. No gross focal neurologic deficits Skin:  Skin is warm, dry and intact.  Psychiatric: Mood and affect are normal.  ____________________________________________    EKG  EKG viewed and interpreted by myself shows a sinus rhythm at 70 bpm with a narrow QRS, normal axis, slight PR prolongation consistent with first-degree AV block otherwise no concerning changes.  ____________________________________________   INITIAL IMPRESSION / ASSESSMENT AND PLAN / ED COURSE  Pertinent labs & imaging results that were available during my care of the patient were reviewed by me and considered in my medical decision making (see chart for details).   Patient presents to the emergency department for upper abdominal discomfort nausea vomiting starting this morning.  Differential would include gastritis, gastroenteritis, gallbladder pathology, pancreatitis.  We will check labs, treat nausea, IV hydrate and continue to closely monitor.  Patient agreeable.  Does not wish for any pain medication at this time.  Labs are largely within normal limits.  Patient states she is feeling much better.  We will discharge with Zofran and supportive care.  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  BLAYNE GARLICK was evaluated in Emergency Department on 12/15/2018 for the symptoms described in the history of present illness. She was evaluated in the context of the global COVID-19 pandemic, which necessitated consideration that the patient might be at risk for infection with the SARS-CoV-2 virus that causes COVID-19. Institutional protocols and algorithms that pertain to the evaluation of patients at risk for COVID-19 are in a state of rapid change based on  information released by regulatory bodies including the CDC and federal and state organizations. These policies and algorithms were followed during the patient's care in the ED.  ____________________________________________   FINAL CLINICAL IMPRESSION(S) / ED DIAGNOSES  Abdominal pain Nausea vomiting   Harvest Dark, MD 12/15/18 1230

## 2018-12-25 ENCOUNTER — Other Ambulatory Visit: Payer: BC Managed Care – PPO

## 2019-02-02 DIAGNOSIS — M059 Rheumatoid arthritis with rheumatoid factor, unspecified: Secondary | ICD-10-CM

## 2019-02-02 MED ORDER — ENBREL SURECLICK 50 MG/ML (1 ML) SUBCUTANEOUS PEN INJECTOR
PEN_INJECTOR | 1 refills | 0 days | Status: CP
Start: 2019-02-02 — End: ?

## 2019-02-18 ENCOUNTER — Ambulatory Visit: Payer: Self-pay

## 2019-02-18 ENCOUNTER — Other Ambulatory Visit: Payer: Self-pay

## 2019-02-18 DIAGNOSIS — Z23 Encounter for immunization: Secondary | ICD-10-CM

## 2019-06-14 ENCOUNTER — Other Ambulatory Visit: Payer: Self-pay | Admitting: Family Medicine

## 2019-06-14 MED ORDER — VALACYCLOVIR HCL 1 G PO TABS: ORAL_TABLET | ORAL | 2 refills | Status: AC

## 2019-09-27 DIAGNOSIS — M059 Rheumatoid arthritis with rheumatoid factor, unspecified: Principal | ICD-10-CM

## 2019-09-27 MED ORDER — ENBREL SURECLICK 50 MG/ML (1 ML) SUBCUTANEOUS PEN INJECTOR
SUBCUTANEOUS | 0 refills | 84.00000 days | Status: CP
Start: 2019-09-27 — End: ?

## 2019-09-28 ENCOUNTER — Encounter
Admit: 2019-09-28 | Discharge: 2019-09-29 | Payer: PRIVATE HEALTH INSURANCE | Attending: Rheumatology | Primary: Rheumatology

## 2019-09-28 ENCOUNTER — Encounter: Admit: 2019-09-28 | Discharge: 2019-09-29 | Payer: PRIVATE HEALTH INSURANCE

## 2019-09-28 DIAGNOSIS — Z0000 Encounter for general adult medical examination without abnormal findings: Principal | ICD-10-CM

## 2019-09-28 DIAGNOSIS — M059 Rheumatoid arthritis with rheumatoid factor, unspecified: Principal | ICD-10-CM

## 2019-10-05 DIAGNOSIS — M059 Rheumatoid arthritis with rheumatoid factor, unspecified: Principal | ICD-10-CM

## 2019-11-18 HISTORY — PX: FOOT SURGERY: SHX648

## 2019-12-24 DIAGNOSIS — M059 Rheumatoid arthritis with rheumatoid factor, unspecified: Principal | ICD-10-CM

## 2019-12-24 MED ORDER — METHOTREXATE SODIUM 2.5 MG TABLET
ORAL_TABLET | ORAL | 1 refills | 105 days | Status: CP
Start: 2019-12-24 — End: ?

## 2020-02-01 ENCOUNTER — Ambulatory Visit: Payer: Self-pay | Admitting: Medical

## 2020-02-01 ENCOUNTER — Encounter: Payer: Self-pay | Admitting: Medical

## 2020-02-01 ENCOUNTER — Other Ambulatory Visit: Payer: Self-pay

## 2020-02-01 VITALS — BP 115/81 | HR 71 | Temp 97.7°F | Resp 16

## 2020-02-01 DIAGNOSIS — T783XXA Angioneurotic edema, initial encounter: Secondary | ICD-10-CM

## 2020-02-01 DIAGNOSIS — T7840XA Allergy, unspecified, initial encounter: Secondary | ICD-10-CM

## 2020-02-01 MED ORDER — METHYLPREDNISOLONE SODIUM SUCC 125 MG IJ SOLR
125.0000 mg | Freq: Once | INTRAMUSCULAR | Status: AC
  Administered 2020-02-01: 125 mg via INTRAMUSCULAR

## 2020-02-01 MED ORDER — CETIRIZINE HCL 10 MG PO TABS
10.0000 mg | ORAL_TABLET | Freq: Every day | ORAL | Status: DC
  Administered 2020-02-01: 10 mg via ORAL

## 2020-02-01 MED ORDER — CALCIUM CARBONATE ANTACID 500 MG PO CHEW: 2.0000 | CHEWABLE_TABLET | Freq: Once | ORAL | Status: DC

## 2020-02-01 MED ORDER — EPINEPHRINE 0.3 MG/0.3ML IJ SOAJ: 0.3000 mg | INTRAMUSCULAR | 0 refills | Status: AC | PRN

## 2020-02-01 NOTE — Progress Notes (Signed)
Pt tolerated injection well

## 2020-02-01 NOTE — Patient Instructions (Signed)
Food Allergy  A food allergy is when your body reacts to a food in a way that is not normal. The reaction can be mild or very bad. A very bad allergic reaction is called an anaphylactic reaction (anaphylaxis). A very bad reaction is an emergency. What are the causes? Common foods that can cause a reaction are:  Milk.  Eggs.  Peanuts.  Tree nuts. These include pecans, walnuts, and cashews.  Seafood.  Wheat.  Soy. What are the signs or symptoms? Signs of a mild reaction  Stuffy nose.  Tingling in the mouth.  An itchy, red rash.  Throwing up (vomiting).  Watery poop (diarrhea). Signs of a very bad reaction  Itchy, red, swollen areas of skin (hives).  Swelling of your: ? Eyes. ? Lips. ? Face. ? Mouth. ? Tongue. ? Throat.  Trouble with: ? Breathing. ? Talking. ? Swallowing.  Noisy breathing (wheezing).  Passing out (fainting).  Having any of these feelings: ? Warmth in your face (flushed). ? Dizziness. ? Light-headedness.  Pain in your belly. Follow these instructions at home: If you are being tested for an allergy:  Avoid foods as told by your doctor (elimination diet).  Write down what you eat and drink in a notebook (food diary). Each day, write: ? What you eat and drink and when. ? What problems you have and when. If you have a very bad allergy:   Wear a bracelet or necklace that says you have an allergy.  Carry your allergy kit (anaphylaxis kit) or an allergy shot (epinephrine injection) with you all the time. Use them as told by your doctor.  Make sure that you, your family, and your boss know: ? The signs of a very bad reaction. ? How to use your allergy kit. ? How to give an allergy shot.  If you use an allergy shot: ? Get more right away in case you have another reaction. ? Get help. You can have a life-threatening reaction after taking the medicine (rebound anaphylaxis). General instructions  Avoid the foods that you are allergic  to.  Read food labels. Look for ingredients that you are allergic to.  When you are at a restaurant, tell your server that you have an allergy. Ask if your meal has an ingredient that you are allergic to.  Take medicines only as told by your doctor. Do not drive until the medicine has worn off, unless your doctor says it is okay.  Tell all people who care for you that you have a food allergy. This includes your doctor and dentist.  If you think that you might be allergic to something else, talk with your doctor. Do not eat a food to see if you are allergic to it without talking with your doctor first. Contact a doctor if you:  Have signs of a reaction that have not gone away after 2 days.  Get worse.  Have new signs of a reaction. Get help right away if you have signs of a very bad reaction:  Itchy, red, swollen areas of skin.  Swelling of your: ? Eyes. ? Lips. ? Face. ? Mouth. ? Tongue. ? Throat.  Trouble with: ? Breathing. ? Talking. ? Swallowing.  Noisy breathing (wheezing).  Passing out (fainting).  Having any of these feelings: ? Warmth in your face (flushed). ? Dizziness. ? Light-headedness. ? Pain in your belly. These signs may be an emergency. Do not wait to see if the signs will go away. Use your allergy shot  or allergy kit as you have been told. Get medical help right away. Call your local emergency services (911 in the [place] Do not drive yourself to the hospital. If you had to use your allergy pen, you must go to the emergency room even if the medicine seems to be working. This is important because another allergic reaction may happen within 3 days. Summary  A food allergy is when your body reacts to a food in a way that is not normal.  Avoid the foods that you are allergic to.  Wear a bracelet or necklace that says you have an allergy.  Carry your allergy kit (anaphylaxis kit) or an allergy shot (epinephrine injection) with you all the time. Use them  as told by your doctor. This information is not intended to replace advice given to you by your health care provider. Make sure you discuss any questions you have with your health care provider. Document Revised: 05/06/2017 Document Reviewed: 05/06/2017 Elsevier Patient Education  2020 [employer]  

## 2020-02-01 NOTE — Progress Notes (Signed)
Subjective:    Patient ID: Vanessa Torres, female    DOB: [birth year], 49 y.o.   MRN: [id number]  HPI 49 yo female presents with sparce rash with wheals  on body , swollen lips, woke up this morning with the  Symptom of angio edema. Had chick-fil-a and last ngiht ate crab legs. Itching before bedtime, took benadryl 25 mg po.   Similar symptoms earilier this year, she ate shellfish at that time as well.  Has had several episodes of allergic rxn in the past two years.    Review of Systems  HENT: Negative for sore throat.   Respiratory: Negative for choking, chest tightness, shortness of breath and wheezing.   Cardiovascular: Negative for chest pain.  Gastrointestinal: Negative for vomiting.  Skin: Positive for rash (abdomen , upper extremitis, left shoulder).  Neurological: Negative for dizziness and syncope.   Allergies  Allergen Reactions  . Molds & Smuts     Congestion in the nose and chest       Objective:   Physical Exam Vitals and nursing note reviewed.  Constitutional:      Appearance: Normal appearance.  HENT:     Head: Normocephalic and atraumatic.     Mouth/Throat:     Mouth: Mucous membranes are moist.     Pharynx: Oropharynx is clear.     Comments: Mild uvula swelling otherwise patent airway Eyes:     Extraocular Movements: Extraocular movements intact.     Pupils: Pupils are equal, round, and reactive to light.  Cardiovascular:     Rate and Rhythm: Normal rate and regular rhythm.     Pulses: Normal pulses.     Heart sounds: Normal heart sounds. No murmur heard.  No friction rub. No gallop.   Pulmonary:     Effort: Pulmonary effort is normal.     Breath sounds: Normal breath sounds.  Musculoskeletal:        General: Normal range of motion.     Cervical back: Normal range of motion and neck supple.  Lymphadenopathy:     Cervical: No cervical adenopathy.  Skin:    General: Skin is warm and dry.     Findings: Erythema and rash (uticarial) present.   Neurological:     General: No focal deficit present.     Mental Status: She is alert and oriented to person, place, and time.  Psychiatric:        Mood and Affect: Mood normal.        Behavior: Behavior normal.        Thought Content: Thought content normal.        Judgment: Judgment normal.           Assessment & Plan:  Allergic reaction Angioedema Given Zyrtec, Tums ndc [phone], Solumedrol 125mg  IM. Feeling better at discharge. To go home and take Benadryl  50-100mg . Ice to the lips to help reduce swelling.  Cool /lukewarm showers/baths.  If symptoms worsen to call EMS or go to the nearest hospital. Patient verbalizes understanding and has no questions at discharge. Meds ordered this encounter  Medications  . methylPREDNISolone sodium succinate (SOLU-MEDROL) 125 mg/2 mL injection 125 mg  . cetirizine (ZYRTEC) tablet 10 mg  . DISCONTD: calcium carbonate (TUMS - dosed in mg elemental calcium) chewable tablet 400 mg of elemental calcium  . EPINEPHrine 0.3 mg/0.3 mL IJ SOAJ injection    Sig: Inject 0.3 mg into the muscle as needed for anaphylaxis.    Dispense:  1 each  Refill:  0   Orders Placed This Encounter  Procedures  . Ambulatory referral to ENT    Referral Priority:   Routine    Referral Type:   Consultation    Referral Reason:   Specialty Services Required    Referred to Provider:   Carloyn Manner, MD    Requested Specialty:   Otolaryngology    Number of Visits Requested:   1

## 2020-02-03 ENCOUNTER — Encounter
Admit: 2020-02-03 | Discharge: 2020-02-03 | Disposition: A | Discharge status: Home / Self Care | Payer: PRIVATE HEALTH INSURANCE

## 2020-02-03 ENCOUNTER — Telehealth: Payer: Self-pay | Admitting: Medical

## 2020-02-03 DIAGNOSIS — L509 Urticaria, unspecified: Principal | ICD-10-CM

## 2020-02-03 DIAGNOSIS — T783XXA Angioneurotic edema, initial encounter: Principal | ICD-10-CM

## 2020-02-03 DIAGNOSIS — M0579 Rheumatoid arthritis with rheumatoid factor of multiple sites without organ or systems involvement: Principal | ICD-10-CM

## 2020-02-03 DIAGNOSIS — T7840XD Allergy, unspecified, subsequent encounter: Secondary | ICD-10-CM

## 2020-02-03 MED ORDER — PREDNISONE 20 MG TABLET: tablet | 0 refills | 0 days | Status: AC

## 2020-02-03 MED ORDER — EPINEPHRINE 0.3 MG/0.3 ML INJECTION, AUTO-INJECTOR
Freq: Once | INTRAMUSCULAR | 0 refills | 0.00000 days | Status: CP | PRN
Start: 2020-02-03 — End: 2021-02-02

## 2020-02-03 MED ORDER — PREDNISONE 20 MG TABLET
ORAL_TABLET | 0 refills | 0 days | Status: CP
Start: 2020-02-03 — End: 2020-02-03

## 2020-02-03 NOTE — Telephone Encounter (Signed)
Called patient to see how she was feeling if allergy symptoms of swollen lips and body rash with wheals are resolved. She states she is feeling well and all symptoms are resolved. Encouraged patient to get allergy testing , referral made to  [facility]. Patient verbalizes understanding and has no questions at the end of our conversation.

## 2020-02-21 ENCOUNTER — Ambulatory Visit (INDEPENDENT_AMBULATORY_CARE_PROVIDER_SITE_OTHER): Payer: BC Managed Care – PPO | Admitting: Obstetrics

## 2020-02-21 ENCOUNTER — Other Ambulatory Visit: Payer: Self-pay

## 2020-02-21 ENCOUNTER — Encounter: Payer: Self-pay | Admitting: Obstetrics

## 2020-02-21 ENCOUNTER — Other Ambulatory Visit (HOSPITAL_COMMUNITY)
Admission: RE | Admit: 2020-02-21 | Discharge: 2020-02-21 | Disposition: A | Discharge status: Home / Self Care | Payer: BC Managed Care – PPO | Source: Ambulatory Visit | Attending: Obstetrics | Admitting: Obstetrics

## 2020-02-21 VITALS — BP 110/70 | HR 80 | Ht 72.0 in | Wt 164.0 lb

## 2020-02-21 DIAGNOSIS — Z124 Encounter for screening for malignant neoplasm of cervix: Secondary | ICD-10-CM | POA: Diagnosis not present

## 2020-02-21 DIAGNOSIS — Z01419 Encounter for gynecological examination (general) (routine) without abnormal findings: Secondary | ICD-10-CM | POA: Diagnosis not present

## 2020-02-21 DIAGNOSIS — Z23 Encounter for immunization: Secondary | ICD-10-CM

## 2020-02-21 NOTE — Progress Notes (Signed)
Gynecology Annual Exam  PCP: McLean-Scocuzza, Nino Glow, MD  Chief Complaint:  Chief Complaint  Patient presents with  . Gynecologic Exam    History of Present Illness: Patient is a 49 y.o. G2P0020 presents for annual exam. The patient has no complaints today.   LMP: Patient's last menstrual period was 12/22/2015. Average Interval: absent- early menopause  The patient is not currently sexually active. She currently uses abstinence for contraception. She denies dyspareunia.  The patient does perform self breast exams.  There is no notable family history of breast or ovarian cancer in her family.  The patient wears seatbelts: yes.   The patient has regular exercise: yes.    The patient denies current symptoms of depression.    Review of Systems: ROS  Past Medical History:  Patient Active Problem List   Diagnosis Date Noted  . Well woman exam with routine gynecological exam 02/21/2020    02/21/20 [facility]   . Microcytic anemia 12/29/2017  . Special screening for malignant neoplasms, colon   . Benign neoplasm of ascending colon   . Eczema 07/02/2017  . 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 Vitamin D deficiency 07/02/2017  . Dislocation of jaw 07/02/2017  . Rheumatoid arthritis ([place]) 11/07/2016  . Bilateral pulmonary embolism ([place]) 03/17/2016  . Elevated rheumatoid factor 06/17/2013  . GERD (gastroesophageal reflux disease) 06/17/2013  . Fatigue 06/17/2013  . Lipoma 06/17/2013  . Breast cancer screening 06/17/2013    Past Surgical History:  Past Surgical History:  Procedure Laterality Date  . APPENDECTOMY  1997  . COLONOSCOPY WITH PROPOFOL N/A 12/24/2017   Procedure: COLONOSCOPY WITH PROPOFOL;  Surgeon: Virgel Manifold, MD;  Location: [facility] ENDOSCOPY;  Service: Endoscopy;  Laterality: N/A;  . FOOT SURGERY Right 11/2019   [place]  . INDUCED ABORTION  05/20/1990; 05/21/1995   x2  . KNEE SURGERY Bilateral    x2 each knee  . LIPOMA EXCISION   11/2015   back  . WISDOM TOOTH EXTRACTION      Gynecologic History:  Patient's last menstrual period was 12/22/2015. Contraception: abstinence Last Pap: Results were: NILM 3 years ago. no abnormalities  Last mammogram: 3 years ago Results were: { Obstetric History: G2P0020  Family History:  Family History  Problem Relation Age of Onset  . Cancer Father        prostate cancer died 21  . Hyperlipidemia Father   . Hypertension Father   . Diabetes Father   . Heart disease Father        Died of HF  . Hyperlipidemia Brother   . Heart disease Maternal Grandmother        CAD - died  . Early death Mother 25       Died of complication from double pna  . Bronchitis Brother        not smoker   . Cancer Paternal Grandfather        prostate  . Breast cancer Neg Hx     Social History:  Social History   Socioeconomic History  . Marital status: Divorced    Spouse name: Not on file  . Number of children: 0  . Years of education: 51  . Highest education level: Not on file  Occupational History  . Occupation: Personnel officer: [employer]  Tobacco Use  . Smoking status: Never Smoker  . Smokeless tobacco: Never Used  Vaping Use  . Vaping Use: Never used  Substance and Sexual Activity  .  Alcohol use: No  . Drug use: No  . Sexual activity: Not Currently    Birth control/protection: Other-see comments    Comment: perimenopausal  Other Topics Concern  . Not on file  Social History Narrative   Devlynn grew up in [place], [place]. She attended [employer] and obtained her Bachelor's Degree in Sociology. She also played on the [employer] [phone]. She is currently the [name] at [employer]. She is divorced. Ileene lives in [place], [place] with her dog, Cece. 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 She also enjoys decorating. 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     Played in [employer]    Social Determinants of Health   Financial Resource Strain:   . Difficulty of Paying Living Expenses: Not on file  Food Insecurity:   . Worried About Charity fundraiser in the Last Year: Not on file  . Ran Out of Food in the Last Year: Not on file  Transportation Needs:   . Lack of Transportation (Medical): Not on file  . Lack of Transportation (Non-Medical): Not on file  Physical Activity:   . Days of Exercise per Week: Not on file  . Minutes of Exercise per Session: Not on file  Stress:   . Feeling of Stress : Not on file  Social Connections:   . Frequency of Communication with Friends and Family: Not on file  . Frequency of Social Gatherings with Friends and Family: Not on file  . Attends Religious Services: Not on file  . Active Member of Clubs or Organizations: Not on file  . Attends Archivist Meetings: Not on file  . Marital Status: Not on file  Intimate Partner Violence:   . Fear of Current or Ex-Partner: Not on file  . Emotionally Abused: Not on file  . Physically Abused: Not on file  . Sexually Abused: Not on file    Allergies:  Allergies  Allergen Reactions  . Molds & Smuts     Congestion in the nose and chest    Medications: Prior to Admission medications   Medication Sig Start Date End Date Taking? Authorizing Provider  acetaminophen (TYLENOL) 500 MG tablet Take 1 tablet (500 mg total) by mouth every 8 (eight) hours as needed (pain). (not to exceed 8 tabs/day) 09/21/18  Yes Bartorelli, Brayton Layman, NP  EPINEPHrine 0.3 mg/0.3 mL IJ SOAJ injection Inject 0.3 mg into the muscle as needed for anaphylaxis. 02/01/20  Yes Ratcliffe, Heather R, PA-C  methotrexate (RHEUMATREX) 2.5 MG tablet Take 8 tablets by mouth once a week. 12/16/17  Yes [provider]  Multiple Vitamin (MULTI-VITAMIN) tablet Take 1 tablet by mouth daily.   Yes [provider]  valACYclovir (VALTREX) 1000 MG tablet Take one tablet by mouth daily as needed.  06/14/19  Yes Paulina Fusi, MD  etanercept (ENBREL) 50 MG/ML injection Inject 50 mg into the skin once a week. 06/02/18 06/02/19  [provider]    Physical Exam Vitals: Blood pressure 110/70, pulse 80, height 6' (1.829 m), weight 164 lb (74.4 kg), last menstrual period 12/22/2015.  General: NAD HEENT: normocephalic, anicteric Thyroid: no enlargement, no palpable nodules Pulmonary: No increased work of breathing, CTAB Cardiovascular: RRR, distal pulses 2+ Breast: Breast symmetrical, no tenderness, no palpable nodules or masses, no skin or nipple retraction present, no nipple discharge.  No axillary or supraclavicular lymphadenopathy. Abdomen: NABS, soft, non-tender, non-distended.  Umbilicus without  lesions.  No hepatomegaly, splenomegaly or masses palpable. No evidence of hernia  Genitourinary:  External: Normal external female genitalia.  Normal urethral meatus, normal Bartholin's and Skene's glands.    Vagina: Normal vaginal mucosa, no evidence of prolapse.    Cervix: Grossly normal in appearance, no bleeding  Uterus: Non-enlarged, mobile, normal contour.  No CMT  Adnexa: ovaries non-enlarged, no adnexal masses  Rectal: deferred  Lymphatic: no evidence of inguinal lymphadenopathy Extremities: no edema, erythema, or tenderness Neurologic: Grossly intact Psychiatric: mood appropriate, affect full  Female chaperone present for pelvic and breast  portions of the physical exam    Assessment: 49 y.o. G2P0020 routine annual exam Patient desires health maintenance lab work  Plan: Problem List Items Addressed This Visit      Other   Well woman exam with routine gynecological exam    Other Visit Diagnoses    Women's annual routine gynecological examination    -  Primary   Relevant Orders   Lipid panel   TSH   25-Hydroxy vitamin D Lcms D2+D3   Comprehensive metabolic panel   Cytology - PAP   MM DIGITAL SCREENING BILATERAL   CBC w/Diff/Platelet   Cervical cancer  screening       Need for diphtheria-tetanus-pertussis (Tdap) vaccine       Relevant Orders   Tdap vaccine greater than or equal to 7yo IM (Completed)      1) Mammogram - recommend yearly screening mammogram.  Mammogram Was ordered today   2) STI screening  wasoffered and declined  3) ASCCP guidelines and rational discussed.  Patient opts for every 3 years screening interval  4) Contraception - the patient is currently using  abstinence.  She is happy with her current form of contraception and plans to continue  5) Colonoscopy -- Screening recommended starting at age 82 for average risk individuals, age 41 for individuals deemed at increased risk (including African Americans) and recommended to continue until age 60.  For patient age 54-85 individualized approach is recommended.  Gold standard screening is via colonoscopy, Cologuard screening is an acceptable alternative for patient unwilling or unable to undergo colonoscopy.  "Colorectal cancer screening for average?risk adults: 2018 guideline update from the American Cancer Society"CA: A Cancer Journal for Clinicians: Oct 16, 2016   6) Routine healthcare maintenance including cholesterol, diabetes screening discussed . She asks for a Lipid panel, TSH, Vit D and CMP labwork in addition to her pap smear.  These are drawn today.    7) Return in about 1 year (around 02/20/2021) for annual.  Imagene Riches, CNM  02/21/2020 1:17 PM   [facility] 02/21/2020, 1:11 PM

## 2020-02-23 LAB — CYTOLOGY - PAP
Comment: NEGATIVE
Diagnosis: NEGATIVE
High risk HPV: NEGATIVE

## 2020-02-25 ENCOUNTER — Encounter: Payer: Self-pay | Admitting: Obstetrics

## 2020-02-26 LAB — CBC WITH DIFFERENTIAL
Basophils Absolute: 0 10*3/uL (ref 0.0–0.2)
Basos: 0 %
EOS (ABSOLUTE): 0.2 10*3/uL (ref 0.0–0.4)
Eos: 3 %
Hematocrit: 39.4 % (ref 34.0–46.6)
Hemoglobin: 13 g/dL (ref 11.1–15.9)
Immature Grans (Abs): 0 10*3/uL (ref 0.0–0.1)
Immature Granulocytes: 0 %
Lymphocytes Absolute: 1.7 10*3/uL (ref 0.7–3.1)
Lymphs: 31 %
MCH: 26.6 pg (ref 26.6–33.0)
MCHC: 33 g/dL (ref 31.5–35.7)
MCV: 81 fL (ref 79–97)
Monocytes Absolute: 0.3 10*3/uL (ref 0.1–0.9)
Monocytes: 6 %
Neutrophils Absolute: 3.2 10*3/uL (ref 1.4–7.0)
Neutrophils: 60 %
RBC: 4.88 x10E6/uL (ref 3.77–5.28)
RDW: 14.9 % (ref 11.7–15.4)
WBC: 5.4 10*3/uL (ref 3.4–10.8)

## 2020-02-26 LAB — COMPREHENSIVE METABOLIC PANEL
ALT: 14 IU/L (ref 0–32)
AST: 19 IU/L (ref 0–40)
Albumin/Globulin Ratio: 1.9 (ref 1.2–2.2)
Albumin: 4.7 g/dL (ref 3.8–4.8)
Alkaline Phosphatase: 76 IU/L (ref 44–121)
BUN/Creatinine Ratio: 9 (ref 9–23)
BUN: 9 mg/dL (ref 6–24)
Bilirubin Total: 0.7 mg/dL (ref 0.0–1.2)
CO2: 22 mmol/L (ref 20–29)
Calcium: 9.9 mg/dL (ref 8.7–10.2)
Chloride: 103 mmol/L (ref 96–106)
Creatinine, Ser: 0.95 mg/dL (ref 0.57–1.00)
GFR calc Af Amer: 82 mL/min/{1.73_m2} (ref >59)
GFR calc non Af Amer: 71 mL/min/{1.73_m2} (ref >59)
Globulin, Total: 2.5 g/dL (ref 1.5–4.5)
Glucose: 68 mg/dL (ref 65–99)
Potassium: 4.3 mmol/L (ref 3.5–5.2)
Sodium: 140 mmol/L (ref 134–144)
Total Protein: 7.2 g/dL (ref 6.0–8.5)

## 2020-02-26 LAB — LIPID PANEL
Chol/HDL Ratio: 4.3 ratio (ref 0.0–4.4)
Cholesterol, Total: 277 mg/dL — ABNORMAL HIGH (ref 100–199)
HDL: 64 mg/dL (ref >39)
LDL Chol Calc (NIH): 203 mg/dL — ABNORMAL HIGH (ref 0–99)
Triglycerides: 66 mg/dL (ref 0–149)
VLDL Cholesterol Cal: 10 mg/dL (ref 5–40)

## 2020-02-26 LAB — 25-HYDROXY VITAMIN D LCMS D2+D3
25-Hydroxy, Vitamin D-2: <1 ng/mL
25-Hydroxy, Vitamin D-3: 43 ng/mL
25-Hydroxy, Vitamin D: 44 ng/mL

## 2020-02-26 LAB — TSH: TSH: 0.407 u[IU]/mL — ABNORMAL LOW (ref 0.450–4.500)

## 2020-03-03 NOTE — Progress Notes (Signed)
Phone call to patient about her lab values. Left a voicemail. I also sent a letter suggesting that she take the results to her PCP for a discussion about mediation, if deemed appropriate.

## 2020-03-28 NOTE — Addendum Note (Signed)
Addended by: Imagene Riches on: 03/28/2020 01:37 PM   Modules accepted: Orders

## 2020-05-02 ENCOUNTER — Encounter: Admit: 2020-05-02 | Discharge: 2020-05-02 | Payer: PRIVATE HEALTH INSURANCE

## 2020-05-02 ENCOUNTER — Encounter
Admit: 2020-05-02 | Discharge: 2020-05-02 | Payer: PRIVATE HEALTH INSURANCE | Attending: Rheumatology | Primary: Rheumatology

## 2020-05-02 DIAGNOSIS — Z23 Encounter for immunization: Principal | ICD-10-CM

## 2020-05-02 DIAGNOSIS — L509 Urticaria, unspecified: Principal | ICD-10-CM

## 2020-05-02 DIAGNOSIS — M0579 Rheumatoid arthritis with rheumatoid factor of multiple sites without organ or systems involvement: Principal | ICD-10-CM

## 2020-05-02 DIAGNOSIS — M059 Rheumatoid arthritis with rheumatoid factor, unspecified: Principal | ICD-10-CM

## 2020-05-02 MED ORDER — ENBREL SURECLICK 50 MG/ML (1 ML) SUBCUTANEOUS PEN INJECTOR
SUBCUTANEOUS | 3 refills | 84 days | Status: CP
Start: 2020-05-02 — End: ?

## 2020-05-02 MED ORDER — METHOTREXATE SODIUM 2.5 MG TABLET
ORAL_TABLET | ORAL | 1 refills | 112.00000 days | Status: CP
Start: 2020-05-02 — End: 2020-05-30

## 2020-05-02 MED ORDER — FOLIC ACID 1 MG TABLET
ORAL_TABLET | Freq: Every day | ORAL | 3 refills | 90.00000 days | Status: CP
Start: 2020-05-02 — End: ?

## 2020-05-30 DIAGNOSIS — M059 Rheumatoid arthritis with rheumatoid factor, unspecified: Principal | ICD-10-CM

## 2020-05-30 MED ORDER — METHOTREXATE SODIUM 2.5 MG TABLET
ORAL_TABLET | 2 refills | 0.00000 days | Status: CP
Start: 2020-05-30 — End: ?

## 2020-06-26 ENCOUNTER — Other Ambulatory Visit: Payer: Self-pay | Admitting: Obstetrics

## 2020-06-26 DIAGNOSIS — Z1231 Encounter for screening mammogram for malignant neoplasm of breast: Secondary | ICD-10-CM

## 2020-07-17 ENCOUNTER — Ambulatory Visit: Payer: BC Managed Care – PPO | Admitting: Dermatology

## 2020-07-20 ENCOUNTER — Ambulatory Visit
Admission: RE | Admit: 2020-07-20 | Discharge: 2020-07-20 | Disposition: A | Discharge status: Home / Self Care | Payer: BC Managed Care – PPO | Source: Ambulatory Visit | Attending: Obstetrics | Admitting: Obstetrics

## 2020-07-20 ENCOUNTER — Other Ambulatory Visit: Payer: Self-pay

## 2020-07-20 DIAGNOSIS — Z1231 Encounter for screening mammogram for malignant neoplasm of breast: Secondary | ICD-10-CM

## 2020-09-27 DIAGNOSIS — M059 Rheumatoid arthritis with rheumatoid factor, unspecified: Principal | ICD-10-CM

## 2020-09-30 ENCOUNTER — Emergency Department
Admission: EM | Admit: 2020-09-30 | Discharge: 2020-10-01 | Disposition: A | Discharge status: Home / Self Care | Payer: BC Managed Care – PPO | Attending: Emergency Medicine | Admitting: Emergency Medicine

## 2020-09-30 DIAGNOSIS — R110 Nausea: Secondary | ICD-10-CM | POA: Diagnosis not present

## 2020-09-30 DIAGNOSIS — J45909 Unspecified asthma, uncomplicated: Secondary | ICD-10-CM | POA: Insufficient documentation

## 2020-09-30 DIAGNOSIS — R519 Headache, unspecified: Secondary | ICD-10-CM | POA: Diagnosis not present

## 2020-09-30 DIAGNOSIS — R42 Dizziness and giddiness: Secondary | ICD-10-CM | POA: Insufficient documentation

## 2020-09-30 LAB — BASIC METABOLIC PANEL
Anion gap: 11 (ref 5–15)
BUN: 14 mg/dL (ref 6–20)
CO2: 23 mmol/L (ref 22–32)
Calcium: 9.5 mg/dL (ref 8.9–10.3)
Chloride: 104 mmol/L (ref 98–111)
Creatinine, Ser: 0.89 mg/dL (ref 0.44–1.00)
GFR, Estimated: >60 mL/min (ref >60)
Glucose, Bld: 101 mg/dL — ABNORMAL HIGH (ref 70–99)
Potassium: 4 mmol/L (ref 3.5–5.1)
Sodium: 138 mmol/L (ref 135–145)

## 2020-09-30 LAB — CBC
HCT: 37.6 % (ref 36.0–46.0)
Hemoglobin: 12.6 g/dL (ref 12.0–15.0)
MCH: 26.7 pg (ref 26.0–34.0)
MCHC: 33.5 g/dL (ref 30.0–36.0)
MCV: 79.7 fL — ABNORMAL LOW (ref 80.0–100.0)
Platelets: 202 10*3/uL (ref 150–400)
RBC: 4.72 MIL/uL (ref 3.87–5.11)
RDW: 14.4 % (ref 11.5–15.5)
WBC: 6.6 10*3/uL (ref 4.0–10.5)
nRBC: 0 % (ref 0.0–0.2)

## 2020-09-30 LAB — CBG MONITORING, ED: Glucose-Capillary: 106 mg/dL — ABNORMAL HIGH (ref 70–99)

## 2020-09-30 MED ORDER — MECLIZINE HCL 25 MG PO TABS
25.0000 mg | ORAL_TABLET | Freq: Once | ORAL | Status: AC
  Administered 2020-09-30: 25 mg via ORAL
  Filled 2020-09-30: qty 1

## 2020-09-30 MED ORDER — ONDANSETRON 4 MG PO TBDP
4.0000 mg | ORAL_TABLET | Freq: Once | ORAL | Status: AC
  Administered 2020-09-30: 4 mg via ORAL
  Filled 2020-09-30: qty 1

## 2020-09-30 MED ORDER — MECLIZINE HCL 25 MG PO TABS: 25.0000 mg | ORAL_TABLET | Freq: Three times a day (TID) | ORAL | 0 refills | Status: DC | PRN

## 2020-09-30 MED ORDER — ONDANSETRON 4 MG PO TBDP: 4.0000 mg | ORAL_TABLET | Freq: Three times a day (TID) | ORAL | 0 refills | Status: DC | PRN

## 2020-09-30 NOTE — ED Provider Notes (Signed)
[facility] Emergency Department Provider Note   ____________________________________________   Event Date/Time   First MD Initiated Contact with Patient 09/30/20 2343     (approximate)  I have reviewed the triage vital signs and the nursing notes.   HISTORY  Chief Complaint Dizziness    HPI Vanessa Torres is a 50 y.o. female who presents to the ED from home with a chief complaint of dizziness.  Patient had sudden onset dizziness like the room was spinning while bending over to clean approximately 2 hours prior to arrival.  Symptoms associated with mild headache and nausea without vomiting.  Symptoms worsened on movement of head.  Denies blurry vision, slurred speech, confusion, extremity weakness/numbness/tingling.  Denies chest pain, shortness of breath, abdominal pain, dysuria or diarrhea.  Denies recent trauma.  Denies anticoagulant use.     Past Medical History:  Diagnosis Date  . Asthma    exercise induced in college   . Brain aneurysm    noted MRI 2004  . Collagen vascular disease ([place])   . Depression    situational  . DVT (deep vein thrombosis) in pregnancy 12/2015   left calf  . Fibroid   . GERD (gastroesophageal reflux disease)   . Herpes genitalia 1992  . History of Papanicolaou smear of cervix 12/27/11; 04/18/16   -/-; neg  . HSV-2 (herpes simplex virus 2) infection   . Hyperlipidemia   . Lipoma    left shoulder removed   . Ovarian cyst   . Premature ovarian failure 2013   Dr. Cristino Martes, GYN. Symptoms have resolved  . Pulmonary embolism ([place]) 11/2015   with DVT per pt provoked on international 8 hr flight and was on OCP  . Rheumatoid arthritis ([place])    rheumatoid dx 2015 f/u rheumatology Dr. Rosalia Hammers [facility] worse hands, feet, L thumb  . TMJ (dislocation of temporomandibular joint)     Patient Active Problem List   Diagnosis Date Noted  . Well woman exam with routine gynecological exam 02/21/2020  . Microcytic anemia 12/29/2017   . Special screening for malignant neoplasms, colon   . Benign neoplasm of ascending colon   . Eczema 07/02/2017  . 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 Vitamin D deficiency 07/02/2017  . Dislocation of jaw 07/02/2017  . Rheumatoid arthritis ([place]) 11/07/2016  . Bilateral pulmonary embolism ([place]) 03/17/2016  . Elevated rheumatoid factor 06/17/2013  . GERD (gastroesophageal reflux disease) 06/17/2013  . Fatigue 06/17/2013  . Lipoma 06/17/2013  . Breast cancer screening 06/17/2013    Past Surgical History:  Procedure Laterality Date  . APPENDECTOMY  1997  . COLONOSCOPY WITH PROPOFOL N/A 12/24/2017   Procedure: COLONOSCOPY WITH PROPOFOL;  Surgeon: Virgel Manifold, MD;  Location: [facility] ENDOSCOPY;  Service: Endoscopy;  Laterality: N/A;  . FOOT SURGERY Right 11/2019   [place]  . INDUCED ABORTION  05/20/1990; 05/21/1995   x2  . KNEE SURGERY Bilateral    x2 each knee  . LIPOMA EXCISION  11/2015   back  . WISDOM TOOTH EXTRACTION      Prior to Admission medications   Medication Sig Start Date End Date Taking? Authorizing Provider  acetaminophen (TYLENOL) 500 MG tablet Take 1 tablet (500 mg total) by mouth every 8 (eight) hours as needed (pain). (not to exceed 8 tabs/day) 09/21/18   Maury Dus, NP  EPINEPHrine 0.3 mg/0.3 mL IJ SOAJ injection Inject 0.3 mg into the muscle as needed for anaphylaxis. 02/01/20   Ratcliffe, Heather R, PA-C  etanercept (  ENBREL) 50 MG/ML injection Inject 50 mg into the skin once a week. 06/02/18 06/02/19  [provider]  methotrexate (RHEUMATREX) 2.5 MG tablet Take 8 tablets by mouth once a week. 12/16/17   [provider]  Multiple Vitamin (MULTI-VITAMIN) tablet Take 1 tablet by mouth daily.    [provider]  valACYclovir (VALTREX) 1000 MG tablet Take one tablet by mouth daily as needed. 06/14/19   Paulina Fusi, MD    Allergies Molds & smuts  Family History  Problem Relation Age of Onset  . Cancer Father         prostate cancer died 58  . Hyperlipidemia Father   . Hypertension Father   . Diabetes Father   . Heart disease Father        Died of HF  . Hyperlipidemia Brother   . Heart disease Maternal Grandmother        CAD - died  . Early death Mother 2       Died of complication from double pna  . Bronchitis Brother        not smoker   . Cancer Paternal Grandfather        prostate  . Breast cancer Neg Hx     Social History Social History   Tobacco Use  . Smoking status: Never Smoker  . Smokeless tobacco: Never Used  Vaping Use  . Vaping Use: Never used  Substance Use Topics  . Alcohol use: No  . Drug use: No    Review of Systems  Constitutional: No fever/chills Eyes: No visual changes. ENT: No sore throat. Cardiovascular: Denies chest pain. Respiratory: Denies shortness of breath. Gastrointestinal: No abdominal pain.  No nausea, no vomiting.  No diarrhea.  No constipation. Genitourinary: Negative for dysuria. Musculoskeletal: Negative for back pain. Skin: Negative for rash. Neurological: Positive for dizziness.  Negative for headaches, focal weakness or numbness.   ____________________________________________   PHYSICAL EXAM:  VITAL SIGNS: ED Triage Vitals  Enc Vitals Group     BP 09/30/20 2125 116/80     Pulse Rate 09/30/20 2125 72     Resp 09/30/20 2125 16     Temp 09/30/20 2122 97.9 F (36.6 C)     Temp Source 09/30/20 2122 Oral     SpO2 09/30/20 2125 98 %     Weight 09/30/20 2125 175 lb (79.4 kg)     Height 09/30/20 2125 6' (1.829 m)     Head Circumference --      Peak Flow --      Pain Score 09/30/20 2126 2     Pain Loc --      Pain Edu? --      Excl. in [place]? --     Constitutional: Alert and oriented. Well appearing and in no acute distress. Eyes: Conjunctivae are normal. PERRL. EOMI. Head: Atraumatic. Nose: No congestion/rhinnorhea. Mouth/Throat: Mucous membranes are moist.   Neck: No stridor.  No carotid bruits.  Supple neck without  meningismus. Cardiovascular: Normal rate, regular rhythm. Grossly normal heart sounds.  Good peripheral circulation. Respiratory: Normal respiratory effort.  No retractions. Lungs CTAB. Gastrointestinal: Soft and nontender. No distention. No abdominal bruits. No CVA tenderness. Musculoskeletal: No lower extremity tenderness nor edema.  No joint effusions. Neurologic: Alert and oriented x3.  CN II-XII grossly intact. Normal speech and language. No gross focal neurologic deficits are appreciated. No gait instability. Skin:  Skin is warm, dry and intact. No rash noted. Psychiatric: Mood and affect are normal. Speech and behavior  are normal.  ____________________________________________   LABS (all labs ordered are listed, but only abnormal results are displayed)  Labs Reviewed  BASIC METABOLIC PANEL - Abnormal; Notable for the following components:      Result Value   Glucose, Bld 101 (*)    All other components within normal limits  CBC - Abnormal; Notable for the following components:   MCV 79.7 (*)    All other components within normal limits  CBG MONITORING, ED - Abnormal; Notable for the following components:   Glucose-Capillary 106 (*)    All other components within normal limits  URINALYSIS, COMPLETE (UACMP) WITH MICROSCOPIC  POC URINE PREG, ED   ____________________________________________  EKG  ED ECG REPORT I, Taraji Mungo J, the attending physician, personally viewed and interpreted this ECG.   Date: 09/30/2020  EKG Time: 2142  Rate: 63  Rhythm: normal EKG, normal sinus rhythm  Axis: Normal  Intervals:none  ST&T Change: Nonspecific  ____________________________________________  RADIOLOGY I, Brinleigh Tew J, personally viewed and evaluated these images (plain radiographs) as part of my medical decision making, as well as reviewing the written report by the radiologist.  ED MD interpretation: None  Official radiology report(s): No results  found.  ____________________________________________   PROCEDURES  Procedure(s) performed (including Critical Care):  Procedures   ____________________________________________   INITIAL IMPRESSION / ASSESSMENT AND PLAN / ED COURSE  As part of my medical decision making, I reviewed the following data within the [place] History obtained from family, Nursing notes reviewed and incorporated, Labs reviewed, EKG interpreted, Old chart reviewed and Notes from prior ED visits     50 year old female presenting with dizziness.  Differential diagnosis includes but is not limited to CVA, ICH, BPV, metabolic, infectious etiologies, etc.  No focal neurological deficits on examination.  Dizziness is much improved; patient was able to ambulate to the treatment room without difficulty.  Will administer Meclizine, Zofran and write prescriptions for her same to use as needed.  Strict return precautions given.  Patient and daughter verbalized understanding agree with plan of care.      ____________________________________________   FINAL CLINICAL IMPRESSION(S) / ED DIAGNOSES  Final diagnoses:  Vertigo     ED Discharge Orders    None      *Please note:  Vanessa Torres was evaluated in Emergency Department on 09/30/2020 for the symptoms described in the history of present illness. She was evaluated in the context of the global COVID-19 pandemic, which necessitated consideration that the patient might be at risk for infection with the SARS-CoV-2 virus that causes COVID-19. Institutional protocols and algorithms that pertain to the evaluation of patients at risk for COVID-19 are in a state of rapid change based on information released by regulatory bodies including the CDC and federal and state organizations. These policies and algorithms were followed during the patient's care in the ED.  Some ED evaluations and interventions may be delayed as a result of limited staffing  during and the pandemic.*   Note:  This document was prepared using Dragon voice recognition software and may include unintentional dictation errors.   Paulette Blanch, MD 10/01/20 [phone]

## 2020-09-30 NOTE — ED Triage Notes (Signed)
Pt c/o dizziness starting 2 hours ago while bending over to clean. Pt sts she felt like the room started spinning.  Pt denies hx of vertigo or similar symptoms.  Pt also notes headache and nausea without vomiting. Denies blurry vision, weakness, or numbness. Grip strength equal, no facial droop. Pt reports dizziness is worse with position changes and closing eyes.

## 2020-09-30 NOTE — ED Provider Notes (Incomplete)
[facility] Emergency Department Provider Note   ____________________________________________   Event Date/Time   First MD Initiated Contact with Patient 09/30/20 2343     (approximate)  I have reviewed the triage vital signs and the nursing notes.   HISTORY  Chief Complaint Dizziness    HPI Vanessa Torres is a 50 y.o. female who presents to the ED from home with a chief complaint of dizziness.  Patient had sudden onset dizziness like the room was spinning while bending over to clean approximately 2 hours prior to arrival.  Symptoms associated with mild headache and nausea without vomiting.  Symptoms worsened on movement of head.  Denies blurry vision, slurred speech, confusion, extremity weakness/numbness/tingling.  Denies chest pain, shortness of breath, abdominal pain, dysuria or diarrhea.  Denies recent trauma.  Denies anticoagulant use.     Past Medical History:  Diagnosis Date  . Asthma    exercise induced in college   . Brain aneurysm    noted MRI 2004  . Collagen vascular disease ([place])   . Depression    situational  . DVT (deep vein thrombosis) in pregnancy 12/2015   left calf  . Fibroid   . GERD (gastroesophageal reflux disease)   . Herpes genitalia 1992  . History of Papanicolaou smear of cervix 12/27/11; 04/18/16   -/-; neg  . HSV-2 (herpes simplex virus 2) infection   . Hyperlipidemia   . Lipoma    left shoulder removed   . Ovarian cyst   . Premature ovarian failure 2013   Dr. Cristino Martes, GYN. Symptoms have resolved  . Pulmonary embolism ([place]) 11/2015   with DVT per pt provoked on international 8 hr flight and was on OCP  . Rheumatoid arthritis ([place])    rheumatoid dx 2015 f/u rheumatology Dr. Rosalia Hammers [facility] worse hands, feet, L thumb  . TMJ (dislocation of temporomandibular joint)     Patient Active Problem List   Diagnosis Date Noted  . Well woman exam with routine gynecological exam 02/21/2020  . Microcytic anemia 12/29/2017   . Special screening for malignant neoplasms, colon   . Benign neoplasm of ascending colon   . Eczema 07/02/2017  . 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 Vitamin D deficiency 07/02/2017  . Dislocation of jaw 07/02/2017  . Rheumatoid arthritis ([place]) 11/07/2016  . Bilateral pulmonary embolism ([place]) 03/17/2016  . Elevated rheumatoid factor 06/17/2013  . GERD (gastroesophageal reflux disease) 06/17/2013  . Fatigue 06/17/2013  . Lipoma 06/17/2013  . Breast cancer screening 06/17/2013    Past Surgical History:  Procedure Laterality Date  . APPENDECTOMY  1997  . COLONOSCOPY WITH PROPOFOL N/A 12/24/2017   Procedure: COLONOSCOPY WITH PROPOFOL;  Surgeon: Virgel Manifold, MD;  Location: [facility] ENDOSCOPY;  Service: Endoscopy;  Laterality: N/A;  . FOOT SURGERY Right 11/2019   [place], [place]  . INDUCED ABORTION  05/20/1990; 05/21/1995   x2  . KNEE SURGERY Bilateral    x2 each knee  . LIPOMA EXCISION  11/2015   back  . WISDOM TOOTH EXTRACTION      Prior to Admission medications   Medication Sig Start Date End Date Taking? Authorizing Provider  acetaminophen (TYLENOL) 500 MG tablet Take 1 tablet (500 mg total) by mouth every 8 (eight) hours as needed (pain). (not to exceed 8 tabs/day) 09/21/18   Maury Dus, NP  EPINEPHrine 0.3 mg/0.3 mL IJ SOAJ injection Inject 0.3 mg into the muscle as needed for anaphylaxis. 02/01/20   Ratcliffe, Heather R, PA-C  etanercept (  ENBREL) 50 MG/ML injection Inject 50 mg into the skin once a week. 06/02/18 06/02/19  [provider]  methotrexate (RHEUMATREX) 2.5 MG tablet Take 8 tablets by mouth once a week. 12/16/17   [provider]  Multiple Vitamin (MULTI-VITAMIN) tablet Take 1 tablet by mouth daily.    [provider]  valACYclovir (VALTREX) 1000 MG tablet Take one tablet by mouth daily as needed. 06/14/19   Paulina Fusi, MD    Allergies Molds & smuts  Family History  Problem Relation Age of Onset  . Cancer Father         prostate cancer died 37  . Hyperlipidemia Father   . Hypertension Father   . Diabetes Father   . Heart disease Father        Died of HF  . Hyperlipidemia Brother   . Heart disease Maternal Grandmother        CAD - died  . Early death Mother 78       Died of complication from double pna  . Bronchitis Brother        not smoker   . Cancer Paternal Grandfather        prostate  . Breast cancer Neg Hx     Social History Social History   Tobacco Use  . Smoking status: Never Smoker  . Smokeless tobacco: Never Used  Vaping Use  . Vaping Use: Never used  Substance Use Topics  . Alcohol use: No  . Drug use: No    Review of Systems  Constitutional: No fever/chills Eyes: No visual changes. ENT: No sore throat. Cardiovascular: Denies chest pain. Respiratory: Denies shortness of breath. Gastrointestinal: No abdominal pain.  No nausea, no vomiting.  No diarrhea.  No constipation. Genitourinary: Negative for dysuria. Musculoskeletal: Negative for back pain. Skin: Negative for rash. Neurological: Positive for dizziness.  Negative for headaches, focal weakness or numbness.   ____________________________________________   PHYSICAL EXAM:  VITAL SIGNS: ED Triage Vitals  Enc Vitals Group     BP 09/30/20 2125 116/80     Pulse Rate 09/30/20 2125 72     Resp 09/30/20 2125 16     Temp 09/30/20 2122 97.9 F (36.6 C)     Temp Source 09/30/20 2122 Oral     SpO2 09/30/20 2125 98 %     Weight 09/30/20 2125 175 lb (79.4 kg)     Height 09/30/20 2125 6' (1.829 m)     Head Circumference --      Peak Flow --      Pain Score 09/30/20 2126 2     Pain Loc --      Pain Edu? --      Excl. in [place]? --     Constitutional: Alert and oriented. Well appearing and in no acute distress. Eyes: Conjunctivae are normal. PERRL. EOMI. Head: Atraumatic. Nose: No congestion/rhinnorhea. Mouth/Throat: Mucous membranes are moist.   Neck: No stridor.  No carotid bruits.  Supple neck without  meningismus. Cardiovascular: Normal rate, regular rhythm. Grossly normal heart sounds.  Good peripheral circulation. Respiratory: Normal respiratory effort.  No retractions. Lungs CTAB. Gastrointestinal: Soft and nontender. No distention. No abdominal bruits. No CVA tenderness. Musculoskeletal: No lower extremity tenderness nor edema.  No joint effusions. Neurologic: Alert and oriented x3.  CN II-XII grossly intact. Normal speech and language. No gross focal neurologic deficits are appreciated. No gait instability. Skin:  Skin is warm, dry and intact. No rash noted. Psychiatric: Mood and affect are normal. Speech and behavior  are normal.  ____________________________________________   LABS (all labs ordered are listed, but only abnormal results are displayed)  Labs Reviewed  BASIC METABOLIC PANEL - Abnormal; Notable for the following components:      Result Value   Glucose, Bld 101 (*)    All other components within normal limits  CBC - Abnormal; Notable for the following components:   MCV 79.7 (*)    All other components within normal limits  CBG MONITORING, ED - Abnormal; Notable for the following components:   Glucose-Capillary 106 (*)    All other components within normal limits  URINALYSIS, COMPLETE (UACMP) WITH MICROSCOPIC  POC URINE PREG, ED   ____________________________________________  EKG  ED ECG REPORT I, Lindee Leason J, the attending physician, personally viewed and interpreted this ECG.   Date: 09/30/2020  EKG Time: 2142  Rate: 63  Rhythm: normal EKG, normal sinus rhythm  Axis: Normal  Intervals:none  ST&T Change: Nonspecific  ____________________________________________  RADIOLOGY I, Cassadi Purdie J, personally viewed and evaluated these images (plain radiographs) as part of my medical decision making, as well as reviewing the written report by the radiologist.  ED MD interpretation: None  Official radiology report(s): No results  found.  ____________________________________________   PROCEDURES  Procedure(s) performed (including Critical Care):  Procedures   ____________________________________________   INITIAL IMPRESSION / ASSESSMENT AND PLAN / ED COURSE  As part of my medical decision making, I reviewed the following data within the [place] History obtained from family, Nursing notes reviewed and incorporated, Labs reviewed, EKG interpreted, Old chart reviewed and Notes from prior ED visits     50 year old female presenting with dizziness.  Differential diagnosis includes but is not limited to CVA, ICH, BPV, metabolic, infectious etiologies, etc.  No focal neurological deficits on examination.  Dizziness is much improved; patient was able to ambulate to the treatment room without difficulty.  Will administer Meclizine, Zofran and write prescriptions for her same to use as needed.  Strict return precautions given.  Patient and daughter verbalized understanding agree with plan of care.      ____________________________________________   FINAL CLINICAL IMPRESSION(S) / ED DIAGNOSES  Final diagnoses:  Vertigo     ED Discharge Orders    None      *Please note:  ELDENA DEDE was evaluated in Emergency Department on 09/30/2020 for the symptoms described in the history of present illness. She was evaluated in the context of the global COVID-19 pandemic, which necessitated consideration that the patient might be at risk for infection with the SARS-CoV-2 virus that causes COVID-19. Institutional protocols and algorithms that pertain to the evaluation of patients at risk for COVID-19 are in a state of rapid change based on information released by regulatory bodies including the CDC and federal and state organizations. These policies and algorithms were followed during the patient's care in the ED.  Some ED evaluations and interventions may be delayed as a result of limited staffing  during and the pandemic.*   Note:  This document was prepared using Dragon voice recognition software and may include unintentional dictation errors.

## 2020-09-30 NOTE — Discharge Instructions (Addendum)
You may take medicines as needed for dizziness and nausea (Meclizine/Zofran #20).  Return to the ER for worsening symptoms, persistent vomiting, difficulty breathing or other concerns.

## 2020-10-01 LAB — URINALYSIS, COMPLETE (UACMP) WITH MICROSCOPIC
Bacteria, UA: NONE SEEN
Bilirubin Urine: NEGATIVE
Glucose, UA: NEGATIVE mg/dL
Hgb urine dipstick: NEGATIVE
Ketones, ur: NEGATIVE mg/dL
Leukocytes,Ua: NEGATIVE
Nitrite: NEGATIVE
Protein, ur: NEGATIVE mg/dL
Specific Gravity, Urine: 1.003 — ABNORMAL LOW (ref 1.005–1.030)
pH: 5 (ref 5.0–8.0)

## 2020-10-31 ENCOUNTER — Encounter
Admit: 2020-10-31 | Discharge: 2020-10-31 | Payer: PRIVATE HEALTH INSURANCE | Attending: Rheumatology | Primary: Rheumatology

## 2020-10-31 ENCOUNTER — Encounter: Admit: 2020-10-31 | Discharge: 2020-10-31 | Payer: PRIVATE HEALTH INSURANCE

## 2020-10-31 DIAGNOSIS — M0579 Rheumatoid arthritis with rheumatoid factor of multiple sites without organ or systems involvement: Principal | ICD-10-CM

## 2020-10-31 DIAGNOSIS — M059 Rheumatoid arthritis with rheumatoid factor, unspecified: Principal | ICD-10-CM

## 2020-10-31 MED ORDER — METHOTREXATE SODIUM 2.5 MG TABLET
ORAL_TABLET | ORAL | 3 refills | 105 days | Status: CP
Start: 2020-10-31 — End: ?

## 2021-04-11 DIAGNOSIS — M059 Rheumatoid arthritis with rheumatoid factor, unspecified: Principal | ICD-10-CM

## 2021-04-11 MED ORDER — ENBREL SURECLICK 50 MG/ML (1 ML) SUBCUTANEOUS PEN INJECTOR
0 refills | 0 days
Start: 2021-04-11 — End: ?

## 2021-04-14 MED ORDER — ENBREL SURECLICK 50 MG/ML (1 ML) SUBCUTANEOUS PEN INJECTOR
0 refills | 0 days | Status: CP
Start: 2021-04-14 — End: ?

## 2021-05-15 ENCOUNTER — Ambulatory Visit: Admit: 2021-05-15 | Discharge: 2021-05-16 | Payer: PRIVATE HEALTH INSURANCE

## 2021-05-15 DIAGNOSIS — M059 Rheumatoid arthritis with rheumatoid factor, unspecified: Principal | ICD-10-CM

## 2021-05-15 DIAGNOSIS — Z79631 Methotrexate, long term, current use: Principal | ICD-10-CM

## 2021-05-15 MED ORDER — LEUCOVORIN CALCIUM 5 MG TABLET
ORAL_TABLET | ORAL | 3 refills | 84 days | Status: CP
Start: 2021-05-15 — End: 2022-05-15

## 2021-05-15 MED ORDER — ENBREL SURECLICK 50 MG/ML (1 ML) SUBCUTANEOUS PEN INJECTOR
Freq: Once | SUBCUTANEOUS | 3 refills | 12.00000 days | Status: CP
Start: 2021-05-15 — End: 2021-05-15

## 2021-05-17 DIAGNOSIS — M059 Rheumatoid arthritis with rheumatoid factor, unspecified: Principal | ICD-10-CM

## 2021-05-17 MED ORDER — ENBREL SURECLICK 50 MG/ML (1 ML) SUBCUTANEOUS PEN INJECTOR
SUBCUTANEOUS | 28 refills | 84.00000 days | Status: CP
Start: 2021-05-17 — End: 2028-05-09

## 2021-05-29 ENCOUNTER — Other Ambulatory Visit (HOSPITAL_COMMUNITY)
Admission: RE | Admit: 2021-05-29 | Discharge: 2021-05-29 | Disposition: A | Discharge status: Home / Self Care | Payer: BC Managed Care – PPO | Source: Ambulatory Visit | Attending: Obstetrics | Admitting: Obstetrics

## 2021-05-29 ENCOUNTER — Other Ambulatory Visit: Payer: Self-pay

## 2021-05-29 ENCOUNTER — Ambulatory Visit: Payer: BC Managed Care – PPO | Admitting: Obstetrics

## 2021-05-29 ENCOUNTER — Encounter: Payer: Self-pay | Admitting: Obstetrics

## 2021-05-29 VITALS — BP 122/74 | Ht 72.0 in

## 2021-05-29 DIAGNOSIS — N898 Other specified noninflammatory disorders of vagina: Secondary | ICD-10-CM | POA: Insufficient documentation

## 2021-05-29 DIAGNOSIS — N941 Unspecified dyspareunia: Secondary | ICD-10-CM | POA: Insufficient documentation

## 2021-05-29 NOTE — Progress Notes (Signed)
Obstetrics & Gynecology Office Visit   Chief Complaint:  Chief Complaint  Patient presents with   Pelvic Pain    History of Present Illness: 51 y.o. G2P0020 presenting for initial evaluation of dyspareunia.  Symptoms onset was several months ago and symptoms have been stable.  The patient is menopausal.  She is not currently on any HRT or hormonal medications.  She has never been pregnanct, and was not sexually active until earlier this year.  Pain is most pronounced with initial penetration, with deep penetration and during coitus, and post coital.  She denies postcoital spotting.  Last pap smear on 02/21/2020 was no abnormalities.  She denies a history of sexually transmitted infections.  Associated symptoms include dyspareunia.  She denies recent antibiotic exposure, denies changes in soaps, detergents coinciding with the onset of her symptoms.  She has not previously self treated or been under treatment by another provider for these symptoms.   Review of Systems: Review of Systems  Constitutional: Negative.   HENT: Negative.    Eyes: Negative.   Respiratory: Negative.    Cardiovascular: Negative.   Gastrointestinal: Negative.   Genitourinary: Negative.   Musculoskeletal: Negative.   Skin: Negative.   Neurological: Negative.   Endo/Heme/Allergies: Negative.   Psychiatric/Behavioral: Negative.      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   Vitamin D deficiency 07/02/2017   Dislocation of jaw 07/02/2017   Rheumatoid arthritis ([place]) 11/07/2016   Bilateral pulmonary embolism ([place]) 03/17/2016   Elevated rheumatoid factor 06/17/2013    GERD (gastroesophageal reflux disease) 06/17/2013   Fatigue 06/17/2013   Lipoma 06/17/2013   Breast cancer screening 06/17/2013    Past Surgical History:  Past Surgical History:  Procedure Laterality Date   APPENDECTOMY  1997   COLONOSCOPY WITH PROPOFOL N/A 12/24/2017   Procedure: COLONOSCOPY WITH PROPOFOL;  Surgeon: Virgel Manifold, MD;  Location: [facility] ENDOSCOPY;  Service: Endoscopy;  Laterality: N/A;   FOOT SURGERY Right 11/2019   [place], [place]   INDUCED ABORTION  05/20/1990; 05/21/1995   x2   KNEE SURGERY Bilateral    x2 each knee   LIPOMA EXCISION  11/2015   back   WISDOM TOOTH EXTRACTION      Gynecologic History: Patient's last menstrual period was 12/22/2015.  Obstetric History: G2P0020  Family History:  Family History  Problem Relation Age of Onset   Cancer Father        prostate cancer died 106   Hyperlipidemia Father    Hypertension Father    Diabetes Father    Heart disease Father        Died of HF   Hyperlipidemia Brother    Heart disease Maternal Grandmother        CAD - died   Early death Mother 6       Died of complication from double pna   Bronchitis Brother        not smoker    Cancer Paternal Grandfather        prostate   Breast cancer Neg Hx     Social History:  Social History   Socioeconomic History   Marital  status: Divorced    Spouse name: Not on file   Number of children: 0   Years of education: 16   Highest education level: Not on file  Occupational History   Occupation: Womens Patent examiner: [employer]  Tobacco Use   Smoking status: Never   Smokeless tobacco: Never  Vaping Use   Vaping Use: Never used  Substance and Sexual Activity   Alcohol use: No   Drug use: No   Sexual activity: Not Currently    Birth control/protection: Other-see comments    Comment: perimenopausal  Other Topics Concern   Not on file  Social History Narrative   Tahara grew up in [place], [place]. She attended [employer] and obtained  her Bachelor's Degree in Sociology. She also played on the [employer] [phone]. She is currently the [name] at [employer]. She is divorced. Taura lives in [place], [place] with her dog, Cece. 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 She also enjoys decorating. 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    Played in [employer]    Social Determinants of Health   Financial Resource Strain: Not on file  Food Insecurity: Not on file  Transportation Needs: Not on file  Physical Activity: Not on file  Stress: Not on file  Social Connections: Not on file  Intimate Partner Violence: Not on file    Allergies:  Allergies  Allergen Reactions   Molds & Smuts     Congestion in the nose and chest    Medications: Prior to Admission medications   Medication Sig Start Date End Date Taking? Authorizing Provider  acetaminophen (TYLENOL) 500 MG tablet Take 1 tablet (500 mg total) by mouth every 8 (eight) hours as needed (pain). (not to exceed 8 tabs/day) 09/21/18   Maury Dus, NP  EPINEPHrine 0.3 mg/0.3 mL IJ SOAJ injection Inject 0.3 mg into the muscle as needed for anaphylaxis. 02/01/20   Ratcliffe, Heather R, PA-C  etanercept (ENBREL) 50 MG/ML injection Inject 50 mg into the skin once a week. 06/02/18 06/02/19  [provider]  meclizine (ANTIVERT) 25 MG tablet Take 1 tablet (25 mg total) by mouth 3 (three) times daily as needed for dizziness or nausea. 09/30/20   Paulette Blanch, MD  methotrexate (RHEUMATREX) 2.5 MG tablet Take 8 tablets by mouth once a week. 12/16/17   [provider]  Multiple Vitamin (MULTI-VITAMIN) tablet Take 1 tablet by mouth daily.    [provider]  ondansetron (ZOFRAN ODT) 4 MG disintegrating tablet Take 1 tablet (4 mg total) by mouth every 8 (eight) hours as needed for nausea or vomiting. 09/30/20   Paulette Blanch, MD  valACYclovir (VALTREX) 1000 MG tablet  Take one tablet by mouth daily as needed. 06/14/19   Paulina Fusi, MD    Physical Exam Blood pressure 122/74, height 6' (1.829 m), last menstrual period 12/22/2015.  Patient's last menstrual period was 12/22/2015.  General: NAD HEENT: normocephalic, anicteric Thyroid: no enlargement, no palpable nodules Pulmonary: No increased work of breathing Abdomen: NABS, soft, non-tender, non-distended.  Umbilicus without lesions.  No hepatomegaly, splenomegaly or masses palpable. No evidence of hernia  Genitourinary:  External: Normal external female genitalia.  Normal urethral meatus, normal  Bartholin's and Skene's glands.    Vagina: Normal vaginal mucosa, no evidence of prolapse.    Cervix: Grossly normal in appearance, no bleeding  Uterus: Non-enlarged, retroverted,mobile, normal contour.  No CMT  Adnexa: ovaries non-enlarged, no adnexal masses  Rectal: deferred  Lymphatic: no evidence of inguinal lymphadenopathy Extremities: no edema, erythema, or tenderness Neurologic: Grossly intact Psychiatric: mood appropriate, affect full  Female chaperone present for pelvic  portions of the physical exam  Assessment: 51 y.o. G2P0020with c/o dypareunia, postmenopausal  Plan: Problem List Items Addressed This Visit       Other   Dyspareunia, female   Other Visit Diagnoses     Vaginal discharge    -  Primary   Relevant Orders   Cervicovaginal ancillary only     STI screening offered and accepted. We discussed the liely lower estrogen levels as the contributor to her pain with IC. Initial step is to use adequate lubrication; allow time for her to "warm up" before penetration;explore different positions for intercourse. She is over due for an annual per her report- discussed option of an estrogen cream that could be prescribed. She will schedule an annual appt and ma request hormonal treatment at that time.   Imagene Riches, CNM  05/29/2021 2:25 PM   [facility], [facility] 05/29/2021, 2:18 PM

## 2021-05-30 ENCOUNTER — Encounter: Payer: Self-pay | Admitting: Obstetrics and Gynecology

## 2021-05-30 ENCOUNTER — Other Ambulatory Visit (HOSPITAL_COMMUNITY)
Admission: RE | Admit: 2021-05-30 | Discharge: 2021-05-30 | Disposition: A | Discharge status: Home / Self Care | Payer: BC Managed Care – PPO | Source: Ambulatory Visit | Attending: Obstetrics and Gynecology | Admitting: Obstetrics and Gynecology

## 2021-05-30 ENCOUNTER — Ambulatory Visit (INDEPENDENT_AMBULATORY_CARE_PROVIDER_SITE_OTHER): Payer: BC Managed Care – PPO | Admitting: Obstetrics and Gynecology

## 2021-05-30 VITALS — BP 110/70 | Ht 72.0 in | Wt 178.9 lb

## 2021-05-30 DIAGNOSIS — Z124 Encounter for screening for malignant neoplasm of cervix: Secondary | ICD-10-CM | POA: Insufficient documentation

## 2021-05-30 DIAGNOSIS — N950 Postmenopausal bleeding: Secondary | ICD-10-CM

## 2021-05-30 DIAGNOSIS — N941 Unspecified dyspareunia: Secondary | ICD-10-CM | POA: Insufficient documentation

## 2021-05-30 DIAGNOSIS — Z01419 Encounter for gynecological examination (general) (routine) without abnormal findings: Secondary | ICD-10-CM | POA: Insufficient documentation

## 2021-05-30 DIAGNOSIS — Z1231 Encounter for screening mammogram for malignant neoplasm of breast: Secondary | ICD-10-CM | POA: Diagnosis not present

## 2021-05-30 LAB — CERVICOVAGINAL ANCILLARY ONLY
Bacterial Vaginitis (gardnerella): NEGATIVE
Candida Glabrata: NEGATIVE
Candida Vaginitis: NEGATIVE
Chlamydia: NEGATIVE
Comment: NEGATIVE
Comment: NEGATIVE
Comment: NEGATIVE
Comment: NEGATIVE
Comment: NEGATIVE
Comment: NORMAL
Neisseria Gonorrhea: NEGATIVE
Trichomonas: NEGATIVE

## 2021-05-30 NOTE — Progress Notes (Incomplete)
Gynecology Annual Exam  PCP: Patient, No Pcp Per (Inactive)  Chief Complaint:  Chief Complaint  Patient presents with   Gynecologic Exam    History of Present Illness: Patient is a 51 y.o. G2P0020 presents for annual exam. The patient has no complaints today.   LMP: Patient's last menstrual period was 12/22/2015. Average Interval: {Desc; regular/irreg:14544}, {numbers 22-35:14824} days Duration of flow: {numbers; 0-10:33138} days  Heavy Menses: {yes/no:63} Dysmenorrhea: {yes/no:63}  She {Actions; denies-reports:120008} passage of large clots She {Actions; denies-reports:120008} sensations of gushing or flooding of blood. She {Actions; denies-reports:120008} accidents where she bleeds through her clothing. She {Actions; denies-reports:120008} that she changes a saturated pad or tampon more frequently than every hour.  She {Actions; denies-reports:120008} that pain from her periods limits her activities.  The patient {DOES_DOES XKG:81856} perform self breast exams.  There {is/is no:19420} notable family history of breast or ovarian cancer in her family.  The patient has regular exercise: ***  The patient {Blank single:19197::"reports","denies"} current symptoms of depression.   PHQ-9: *** GAD-7: ***   Review of Systems: ROS  Past Medical History:  Past Medical History:  Diagnosis Date   Asthma    exercise induced in college    Brain aneurysm    noted MRI 2004   Collagen vascular disease ([place])    Depression    situational   DVT (deep vein thrombosis) in pregnancy 12/2015   left calf   Fibroid    GERD (gastroesophageal reflux disease)    Herpes genitalia 1992   History of Papanicolaou smear of cervix 12/27/11; 04/18/16   -/-; neg   HSV-2 (herpes simplex virus 2) infection    Hyperlipidemia    Lipoma    left shoulder removed    Ovarian cyst    Premature ovarian failure 2013   Dr. Cristino Martes, GYN. Symptoms have resolved   Pulmonary embolism ([place])  11/2015   with DVT per pt provoked on international 8 hr flight and was on OCP   Rheumatoid arthritis ([place])    rheumatoid dx 2015 f/u rheumatology Dr. Rosalia Hammers [facility] worse hands, feet, L thumb   TMJ (dislocation of temporomandibular joint)     Past Surgical History:  Past Surgical History:  Procedure Laterality Date   APPENDECTOMY  1997   COLONOSCOPY WITH PROPOFOL N/A 12/24/2017   Procedure: COLONOSCOPY WITH PROPOFOL;  Surgeon: Virgel Manifold, MD;  Location: [facility] ENDOSCOPY;  Service: Endoscopy;  Laterality: N/A;   FOOT SURGERY Right 11/2019   [place], [place]   INDUCED ABORTION  05/20/1990; 05/21/1995   x2   KNEE SURGERY Bilateral    x2 each knee   LIPOMA EXCISION  11/2015   back   WISDOM TOOTH EXTRACTION      Gynecologic History:  Patient's last menstrual period was 12/22/2015. Menarche: ***  History of fibroids, polyps, or ovarian cysts? : ***  History of PCOS? *** Hstory of Endometriosis? *** History of abnormal pap smears? *** Have you had any sexually transmitted infections in the past?  ***  She *** HPV vaccination in the past. ***  Last Pap: Results were: *** {Findings; lab pap smear results:16707::"NIL and HR HPV+","NIL and HR HPV negative"}   She identifies as a ***. She is ***sexually active with ***.   She {has/denies:315300} dyspareunia. She {has/denies:315300} postcoital bleeding.    Obstetric History: G2P0020  Family History:  Family History  Problem Relation Age of Onset   Cancer Father        prostate cancer died 41   Hyperlipidemia  Father    Hypertension Father    Diabetes Father    Heart disease Father        Died of HF   Hyperlipidemia Brother    Heart disease Maternal Grandmother        CAD - died   Early death Mother 37       Died of complication from double pna   Bronchitis Brother        not smoker    Cancer Paternal Grandfather        prostate   Breast cancer Neg Hx     Social History:  Social History    Socioeconomic History   Marital status: Divorced    Spouse name: Not on file   Number of children: 0   Years of education: 16   Highest education level: Not on file  Occupational History   Occupation: Womens Patent examiner: [employer]  Tobacco Use   Smoking status: Never   Smokeless tobacco: Never  Vaping Use   Vaping Use: Never used  Substance and Sexual Activity   Alcohol use: No   Drug use: No   Sexual activity: Yes    Birth control/protection: Other-see comments    Comment: perimenopausal  Other Topics Concern   Not on file  Social History Narrative   Jaiyanna grew up in [place], [place]. She attended [employer] and obtained her Bachelor's Degree in Sociology. She also played on the [employer] [phone]. She is currently the [name] at [employer]. She is divorced. Aerika lives in [place], [place] with her dog, Cece. 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 She also enjoys decorating. 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    Played in [employer]    Social Determinants of Health   Financial Resource Strain: Not on file  Food Insecurity: Not on file  Transportation Needs: Not on file  Physical Activity: Not on file  Stress: Not on file  Social Connections: Not on file  Intimate Partner Violence: Not on file    Allergies:  Allergies  Allergen Reactions   Molds & Smuts     Congestion in the nose and chest    Medications: Prior to Admission medications   Medication Sig Start Date End Date Taking? Authorizing Provider  EPINEPHrine 0.3 mg/0.3 mL IJ SOAJ injection Inject 0.3 mg into the muscle as needed for anaphylaxis. 02/01/20  Yes Ratcliffe, Heather R, PA-C  methotrexate (RHEUMATREX) 2.5 MG tablet Take 8 tablets by mouth once a week. 12/16/17  Yes [provider]  valACYclovir (VALTREX) 1000 MG tablet Take one tablet by mouth daily as  needed. 06/14/19  Yes Paulina Fusi, MD  acetaminophen (TYLENOL) 500 MG tablet Take 1 tablet (500 mg total) by mouth every 8 (eight) hours as needed (pain). (not to exceed 8 tabs/day) Patient not taking: Reported on 05/30/2021 09/21/18   Maury Dus, NP  etanercept (ENBREL) 50 MG/ML injection Inject 50 mg into the skin once a week. 06/02/18 06/02/19  [provider]  meclizine (ANTIVERT) 25 MG tablet Take 1 tablet (25 mg total) by mouth 3 (three) times daily as needed for dizziness or nausea. 09/30/20   Paulette Blanch, MD  Multiple Vitamin (MULTI-VITAMIN) tablet Take 1 tablet by mouth daily. Patient not taking: Reported on 05/30/2021    [provider]  ondansetron (ZOFRAN ODT) 4 MG disintegrating tablet Take 1 tablet (4 mg  total) by mouth every 8 (eight) hours as needed for nausea or vomiting. Patient not taking: Reported on 05/30/2021 09/30/20   Paulette Blanch, MD    Physical Exam Vitals: Blood pressure 110/70, height 6' (1.829 m), weight 178 lb 14.4 oz (81.1 kg), last menstrual period 12/22/2015.  OBGyn Exam   Female chaperone present for pelvic and breast  portions of the physical exam  Assessment: 51 y.o. G2P0020 routine annual exam  Plan: Problem List Items Addressed This Visit   None Visit Diagnoses     Encounter for annual routine gynecological examination    -  Primary   Cervical cancer screening       Breast cancer screening by mammogram           1) Mammogram - recommend yearly screening mammogram.  Mammogram {Blank single:19197::"Is up to date","Was ordered today"}  2) STI screening was offered and {Blank single:19197::"accepted","declined"}  3) ASCCP guidelines and rational discussed.  Patient opts for {Blank single:19197::"every 5 years","every 3 years","yearly","Discontinue"} screening interval  4) Contraception - ***  5) Colonoscopy -- ***  Screening recommended starting at age 25 for average risk individuals, age 54 for individuals deemed at  increased risk (including African Americans) and recommended to continue until age 32.  For patient age 35-85 individualized approach is recommended.  Gold standard screening is via colonoscopy, Cologuard screening is an acceptable alternative for patient unwilling or unable to undergo colonoscopy.  "Colorectal cancer screening for average-risk adults: 2018 guideline update from the American Cancer Society"CA: A Cancer Journal for Clinicians: Oct 16, 2016   6) Routine healthcare maintenance including cholesterol, diabetes screening discussed {Blank single:19197::"managed by PCP","Ordered today","To return fasting at a later date","Declines"}   Adrian Prows MD, [place], [facility] 05/30/2021 1:58 PM

## 2021-05-30 NOTE — Patient Instructions (Signed)
[employer] Recommended Dietary Allowances for Calcium and Vitamin D  Age (yr) Calcium Recommended Dietary Allowance (mg/day) Vitamin D Recommended Dietary Allowance (international units/day)  9-18 1,300 600  19-50 1,000 600  51-70 1,200 600  71 and older 1,200 800  Data from [employer]. Dietary reference intakes: calcium, vitamin D. [place], [place]: National Academies [employer]; 2011.   Exercising to Stay Healthy To become healthy and stay healthy, it is recommended that you do moderate-intensity and vigorous-intensity exercise. You can tell that you are exercising at a moderate intensity if your heart starts beating faster and you start breathing faster but can still hold a conversation. You can tell that you are exercising at a vigorous intensity if you are breathing much harder and faster and cannot hold a conversation while exercising. How can exercise benefit me? Exercising regularly is important. It has many health benefits, such as: Improving overall fitness, flexibility, and endurance. Increasing bone density. Helping with weight control. Decreasing body fat. Increasing muscle strength and endurance. Reducing stress and tension, anxiety, depression, or anger. Improving overall health. What guidelines should I follow while exercising? Before you start a new exercise program, talk with your health care provider. Do not exercise so much that you hurt yourself, feel dizzy, or get very short of breath. Wear comfortable clothes and wear shoes with good support. Drink plenty of water while you exercise to prevent dehydration or heat stroke. Work out until your breathing and your heartbeat get faster (moderate intensity). How often should I exercise? Choose an activity that you enjoy, and set realistic goals. Your health care provider can help you make an activity plan that is individually designed and works best for you. Exercise regularly as told by your health  care provider. This may include: Doing strength training two times a week, such as: Lifting weights. Using resistance bands. Push-ups. Sit-ups. Yoga. Doing a certain intensity of exercise for a given amount of time. Choose from these options: A total of 150 minutes of moderate-intensity exercise every week. A total of 75 minutes of vigorous-intensity exercise every week. A mix of moderate-intensity and vigorous-intensity exercise every week. Children, pregnant women, people who have not exercised regularly, people who are overweight, and older adults may need to talk with a health care provider about what activities are safe to perform. If you have a medical condition, be sure to talk with your health care provider before you start a new exercise program. What are some exercise ideas? Moderate-intensity exercise ideas include: Walking 1 mile (1.6 km) in about 15 minutes. Biking. Hiking. Golfing. Dancing. Water aerobics. Vigorous-intensity exercise ideas include: Walking 4.5 miles (7.2 km) or more in about 1 hour. Jogging or running 5 miles (8 km) in about 1 hour. Biking 10 miles (16.1 km) or more in about 1 hour. Lap swimming. Roller-skating or in-line skating. Cross-country skiing. Vigorous competitive sports, such as football, basketball, and soccer. Jumping rope. Aerobic dancing. What are some everyday activities that can help me get exercise? Yard work, such as: Pushing a lawn mower. Raking and bagging leaves. Washing your car. Pushing a stroller. Shoveling snow. Gardening. Washing windows or floors. How can I be more active in my day-to-day activities? Use stairs instead of an elevator. Take a walk during your lunch break. If you drive, park your car farther away from your work or school. If you take public transportation, get off one stop early and walk the rest of the way. Stand up or walk around during all of   your indoor phone calls. Get up, stretch, and walk  around every 30 minutes throughout the day. Enjoy exercise with a friend. Support to continue exercising will help you keep a regular routine of activity. Where to find more information You can find more information about exercising to stay healthy from: [place] Department of Health and Human Services: www.hhs.gov Centers for Disease Control and Prevention (CDC): www.cdc.gov Summary Exercising regularly is important. It will improve your overall fitness, flexibility, and endurance. Regular exercise will also improve your overall health. It can help you control your weight, reduce stress, and improve your bone density. Do not exercise so much that you hurt yourself, feel dizzy, or get very short of breath. Before you start a new exercise program, talk with your health care provider. This information is not intended to replace advice given to you by your health care provider. Make sure you discuss any questions you have with your health care provider. Document Revised: 09/01/2020 Document Reviewed: 09/01/2020 Elsevier Patient Education  2022 [employer]. Budget-Friendly Healthy Eating There are many ways to save money at the grocery store and continue to eat healthy. You can be successful if you: Plan meals according to your budget. Make a grocery list and only purchase food according to your grocery list. Prepare food yourself at home. What are tips for following this plan? Reading food labels Compare food labels between brand name foods and the store brand. Often the nutritional value is the same, but the store brand is lower cost. Look for products that do not have added sugar, fat, or salt (sodium). These often cost the same but are healthier for you. Products may be labeled as: Sugar-free. Nonfat. Low-fat. Sodium-free. Low-sodium. Look for lean ground beef labeled as at least 92% lean and 8% fat. Shopping  Buy only the items on your grocery list and go only to the areas of the store  that have the items on your list. Use coupons only for foods and brands you normally buy. Avoid buying items you wouldn't normally buy simply because they are on sale. Check online and in newspapers for weekly deals. Buy healthy items from the bulk bins when available, such as herbs, spices, flour, pasta, nuts, and dried fruit. Buy fruits and vegetables that are in season. Prices are usually lower on in-season produce. Look at the unit price on the price tag. Use it to compare different brands and sizes to find out which item is the best deal. Choose healthy items that are often low-cost, such as carrots, potatoes, apples, bananas, and oranges. Dried or canned beans are a low-cost protein source. Buy in bulk and freeze extra food. Items you can buy in bulk include meats, fish, poultry, frozen fruits, and frozen vegetables. Avoid buying "ready-to-eat" foods, such as pre-cut fruits and vegetables and pre-made salads. If possible, shop around to discover where you can find the best prices. Consider other retailers such as dollar stores, larger wholesale stores, local fruit and vegetable stands, and farmers markets. Do not shop when you are hungry. If you shop while hungry, it may be hard to stick to your list and budget. Resist impulse buying. Use your grocery list as your official plan for the week. Buy a variety of vegetables and fruits by purchasing fresh, frozen, and canned items. Look at the top and bottom shelves for deals. Foods at eye level (eye level of an adult or child) are usually more expensive. Be efficient with your time when shopping. The more time you   spend at the store, the more money you are likely to spend. To save money when choosing more expensive foods like meats and dairy: Choose cheaper cuts of meat, such as bone-in chicken thighs and drumsticks instead of skinless and boneless chicken. When you are ready to prepare the chicken, you can remove the skin yourself to make it  healthier. Choose lean meats like chicken or turkey instead of beef. Choose canned seafood, such as tuna, salmon, or sardines. Buy eggs as a low-cost source of protein. Buy dried beans and peas, such as lentils, split peas, or kidney beans instead of meats. Dried beans and peas are a good alternative source of protein. Buy the larger tubs of yogurt instead of individual-sized containers. Choose water instead of sodas and other sweetened beverages. Avoid buying chips, cookies, and other "junk food." These items are usually expensive and not healthy. Cooking Make extra food and freeze the extras in meal-sized containers or in individual portions for fast meals and snacks. Pre-cook on days when you have extra time to prepare meals in advance. You can keep these meals in the fridge or freezer and reheat for a quick meal. When you come home from the grocery store, wash, peel, and cut fruits and vegetables so they are ready to use and eat. This will help reduce food waste. Meal planning Do not eat out or get fast food. Prepare food at home. Make a grocery list and make sure to bring it with you to the store. If you have a smart phone, you could use your phone to create your shopping list. Plan meals and snacks according to a grocery list and budget you create. Use leftovers in your meal plan for the week. Look for recipes where you can cook once and make enough food for two meals. Prepare budget-friendly types of meals like stews, casseroles, and stir-fry dishes. Try some meatless meals or try "no cook" meals like salads. Make sure that half your plate is filled with fruits or vegetables. Choose from fresh, frozen, or canned fruits and vegetables. If eating canned, remember to rinse them before eating. This will remove any excess salt added for packaging. Summary Eating healthy on a budget is possible if you plan your meals according to your budget, purchase according to your budget and grocery list,  and prepare food yourself. Tips for buying more food on a limited budget include buying generic brands, using coupons only for foods you normally buy, and buying healthy items from the bulk bins when available. Tips for buying cheaper food to replace expensive food include choosing cheaper, lean cuts of meat, and buying dried beans and peas. This information is not intended to replace advice given to you by your health care provider. Make sure you discuss any questions you have with your health care provider. Document Revised: 02/17/2020 Document Reviewed: 02/17/2020 Elsevier Patient Education  2022 [employer]. Bone Health Bones protect organs, store calcium, anchor muscles, and support the whole body. Keeping your bones strong is important, especially as you get older. You can take actions to help keep your bones strong and healthy. Why is keeping my bones healthy important? Keeping your bones healthy is important because your body constantly replaces bone cells. Cells get old, and new cells take their place. As we age, we lose bone cells because the body may not be able to make enough new cells to replace the old cells. The amount of bone cells and bone tissue you have is referred to as   bone mass. The higher your bone mass, the stronger your bones. The aging process leads to an overall loss of bone mass in the body, which can increase the likelihood of: Broken bones. A condition in which the bones become weak and brittle (osteoporosis). A large decline in bone mass occurs in older adults. In women, it occurs about the time of menopause. What actions can I take to keep my bones healthy? Good health habits are important for maintaining healthy bones. This includes eating nutritious foods and exercising regularly. To have healthy bones, you need to get enough of the right minerals and vitamins. Most nutrition experts recommend getting these nutrients from the foods that you eat. In some cases,  taking supplements may also be recommended. Doing certain types of exercise is also important for bone health. What are the nutritional recommendations for healthy bones? Eating a well-balanced diet with plenty of calcium and vitamin D will help to protect your bones. Nutritional recommendations vary from person to person. Ask your health care provider what is healthy for you. Here are some general guidelines. Get enough calcium Calcium is the most important (essential) mineral for bone health. Most people can get enough calcium from their diet, but supplements may be recommended for people who are at risk for osteoporosis. Good sources of calcium include: Dairy products, such as low-fat or nonfat milk, cheese, and yogurt. Dark green leafy vegetables, such as bok choy and broccoli. Foods that have calcium added to them (are fortified). Foods that may be fortified with calcium include orange juice, cereal, bread, soy beverages, and tofu products. Nuts, such as almonds. Follow these recommended amounts for daily calcium intake: Infants, 0-6 months: 200 mg. Infants, 6-12 months: 260 mg. Children, age 1-3: 700 mg. Children, age 4-8: 1,000 mg. Children, age 9-13: 1,300 mg. Teens, age 14-18: 1,300 mg. Adults, age 19-50: 1,000 mg. Adults, age 51-70: Men: 1,000 mg. Women: 1,200 mg. Adults, age 71 or older: 1,200 mg. Pregnant and breastfeeding females: Teens: 1,300 mg. Adults: 1,000 mg. Get enough vitamin D Vitamin D is the most essential vitamin for bone health. It helps the body absorb calcium. Sunlight stimulates the skin to make vitamin D, so be sure to get enough sunlight. If you live in a cold climate or you do not get outside often, your health care provider may recommend that you take vitamin D supplements. Good sources of vitamin D in your diet include: Egg yolks. Saltwater fish. Milk and cereal fortified with vitamin D. Follow these recommended amounts for daily vitamin D  intake: Infants, 0-12 months: 400 international units (IU). Children and teens, age 1-18: 600 international units. Adults, age 59 or younger: 600 international units. Adults, age 60 or older: 600-1,000 international units. Get other important nutrients Other nutrients that are important for bone health include: Phosphorus. This mineral is found in meat, poultry, dairy foods, nuts, and legumes. The recommended daily intake for adult men and adult women is 700 mg. Magnesium. This mineral is found in seeds, nuts, dark green vegetables, and legumes. The recommended daily intake for adult men is 400-420 mg. For adult women, it is 310-320 mg. Vitamin K. This vitamin is found in green leafy vegetables. The recommended daily intake is 120 mcg for adult men and 90 mcg for adult women. What type of physical activity is best for building and maintaining healthy bones? Weight-bearing and strength-building activities are important for building and maintaining healthy bones. Weight-bearing activities cause muscles and bones to work against gravity. Strength-building activities   increase the strength of the muscles that support bones. Weight-bearing and muscle-building activities include: Walking and hiking. Jogging and running. Dancing. Gym exercises. Lifting weights. Tennis and racquetball. Climbing stairs. Aerobics. Adults should get at least 30 minutes of moderate physical activity on most days. Children should get at least 60 minutes of moderate physical activity on most days. Ask your health care provider what type of exercise is best for you. How can I find out if my bone mass is low? Bone mass can be measured with an X-ray test called a bone mineral density (BMD) test. This test is recommended for all women who are age 65 or older. It may also be recommended for: Men who are age 70 or older. People who are at risk for osteoporosis because of: Having a long-term disease that weakens bones, such as  kidney disease or rheumatoid arthritis. Having menopause earlier than normal. Taking medicine that weakens bones, such as steroids, thyroid hormones, or hormone treatment for breast cancer or prostate cancer. Smoking. Drinking three or more alcoholic drinks a day. Being underweight. Sedentary lifestyle. If you find that you have a low bone mass, you may be able to prevent osteoporosis or further bone loss by changing your diet and lifestyle. Where can I find more information? Bone Health & Osteoporosis Foundation: www.nof.org/patients National Institutes of Health: www.bones.nih.gov International Osteoporosis Foundation: www.iofbonehealth.org Summary The aging process leads to an overall loss of bone mass in the body, which can increase the likelihood of broken bones and osteoporosis. Eating a well-balanced diet with plenty of calcium and vitamin D will help to protect your bones. Weight-bearing and strength-building activities are also important for building and maintaining strong bones. Bone mass can be measured with an X-ray test called a bone mineral density (BMD) test. This information is not intended to replace advice given to you by your health care provider. Make sure you discuss any questions you have with your health care provider. Document Revised: 10/18/2020 Document Reviewed: 10/18/2020 Elsevier Patient Education  2022 [employer].  

## 2021-05-30 NOTE — Progress Notes (Signed)
Gynecology Annual Exam  PCP: Vanessa Torres, No Pcp Per (Inactive)  Chief Complaint:  Chief Complaint  Vanessa Torres presents with   Gynecologic Exam    History of Present Illness: Vanessa Torres is a 51 y.o. G2P0020 presents for annual exam.  She has additional complaints today of painful intercourse and vaginal bleeding with intercourse.  She reports that has been several years since she was last sexually active but has a new sexual partner and had pain and difficulty with intercourse.  She reports vaginal dryness.  She has been having symptoms of menopause such as hot flashes sleep disturbances irritability.  She reports that her last menstrual period was 6 years ago at the age of 50.  Her episodes of vaginal bleeding related to this intercourse are her first episodes of vaginal bleeding since that time.  LMP: Vanessa Torres's last menstrual period was 12/22/2015.   The Vanessa Torres does perform self breast exams.  There is no notable family history of breast or ovarian cancer in her family.  The Vanessa Torres has regular exercise: yes cardio and weight training 3 days a week  The Vanessa Torres reports current symptoms of depression.   PHQ-9: 1 GAD-7: 2   Review of Systems: Review of Systems  Constitutional:  Negative for chills, fever, malaise/fatigue and weight loss.  HENT:  Negative for congestion, hearing loss and sinus pain.   Eyes:  Negative for blurred vision and double vision.  Respiratory:  Negative for cough, sputum production, shortness of breath and wheezing.   Cardiovascular:  Negative for chest pain, palpitations, orthopnea and leg swelling.  Gastrointestinal:  Negative for abdominal pain, constipation, diarrhea, nausea and vomiting.  Genitourinary:  Negative for dysuria, flank pain, frequency, hematuria and urgency.  Musculoskeletal:  Negative for back pain, falls and joint pain.  Skin:  Negative for itching and rash.  Neurological:  Negative for dizziness and headaches.  Psychiatric/Behavioral:   Negative for depression, substance abuse and suicidal ideas. The Vanessa Torres is not nervous/anxious.    Past Medical History:  Past Medical History:  Diagnosis Date   Asthma    exercise induced in college    Brain aneurysm    noted MRI 2004   Collagen vascular disease ([place])    Depression    situational   DVT (deep vein thrombosis) in pregnancy 12/2015   left calf   Fibroid    GERD (gastroesophageal reflux disease)    Herpes genitalia 1992   History of Papanicolaou smear of cervix 12/27/11; 04/18/16   -/-; neg   HSV-2 (herpes simplex virus 2) infection    Hyperlipidemia    Lipoma    left shoulder removed    Ovarian cyst    Premature ovarian failure 2013   Dr. Cristino Martes, GYN. Symptoms have resolved   Pulmonary embolism ([place]) 11/2015   with DVT per pt provoked on international 8 hr flight and was on OCP   Rheumatoid arthritis ([place])    rheumatoid dx 2015 f/u rheumatology Dr. Rosalia Hammers [facility] worse hands, feet, L thumb   TMJ (dislocation of temporomandibular joint)     Past Surgical History:  Past Surgical History:  Procedure Laterality Date   APPENDECTOMY  1997   COLONOSCOPY WITH PROPOFOL N/A 12/24/2017   Procedure: COLONOSCOPY WITH PROPOFOL;  Surgeon: Virgel Manifold, MD;  Location: [facility] ENDOSCOPY;  Service: Endoscopy;  Laterality: N/A;   FOOT SURGERY Right 11/2019   [place], [place]   INDUCED ABORTION  05/20/1990; 05/21/1995   x2   KNEE SURGERY Bilateral    x2 each  knee   LIPOMA EXCISION  11/2015   back   WISDOM TOOTH EXTRACTION      Gynecologic History:  Vanessa Torres's last menstrual period was 12/22/2015. Last Pap: 2021 NIL HPV neg  Last mammogram: 2022  Results were: BI-RAD I  Obstetric History: G2P0020  Family History:  Family History  Problem Relation Age of Onset   Cancer Father        prostate cancer died 52   Hyperlipidemia Father    Hypertension Father    Diabetes Father    Heart disease Father        Died of HF   Hyperlipidemia Brother    Heart disease Maternal  Grandmother        CAD - died   Early death Mother 88       Died of complication from double pna   Bronchitis Brother        not smoker    Cancer Paternal Grandfather        prostate   Breast cancer Neg Hx     Social History:  Social History   Socioeconomic History   Marital status: Divorced    Spouse name: Not on file   Number of children: 0   Years of education: 16   Highest education level: Not on file  Occupational History   Occupation: Womens Patent examiner: [employer]  Tobacco Use   Smoking status: Never   Smokeless tobacco: Never  Vaping Use   Vaping Use: Never used  Substance and Sexual Activity   Alcohol use: No   Drug use: No   Sexual activity: Yes    Birth control/protection: Other-see comments    Comment: perimenopausal  Other Topics Concern   Not on file  Social History Narrative   Kadence grew up in [place], [place]. She attended [employer] and obtained her Bachelor's Degree in Sociology. She also played on the [employer] [phone]. She is currently the [name] at [employer]. She is divorced. Avani lives in [place], [place] with her dog, Cece. 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 She also enjoys decorating. 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    Played in [employer]    Social Determinants of Health   Financial Resource Strain: Not on file  Food Insecurity: Not on file  Transportation Needs: Not on file  Physical Activity: Not on file  Stress: Not on file  Social Connections: Not on file  Intimate Partner Violence: Not on file    Allergies:  Allergies  Allergen Reactions   Molds & Smuts     Congestion in the nose and chest    Medications: Prior to Admission medications   Medication Sig Start Date End Date Taking? Authorizing Provider  EPINEPHrine 0.3 mg/0.3 mL IJ SOAJ injection Inject 0.3 mg into the muscle as needed for  anaphylaxis. 02/01/20  Yes Ratcliffe, Heather R, PA-C  methotrexate (RHEUMATREX) 2.5 MG tablet Take 8 tablets by mouth once a week. 12/16/17  Yes [provider]  valACYclovir (VALTREX) 1000 MG tablet Take one tablet by mouth daily as needed. 06/14/19  Yes Paulina Fusi, MD  acetaminophen (TYLENOL) 500 MG tablet Take 1 tablet (500 mg total) by mouth every 8 (eight) hours as needed (pain). (not to exceed 8 tabs/day) Vanessa Torres not taking: Reported on 05/30/2021 09/21/18   Maury Dus, NP  etanercept (ENBREL) 50 MG/ML injection Inject 50 mg into  the skin once a week. 06/02/18 06/02/19  [provider]  meclizine (ANTIVERT) 25 MG tablet Take 1 tablet (25 mg total) by mouth 3 (three) times daily as needed for dizziness or nausea. 09/30/20   Paulette Blanch, MD  Multiple Vitamin (MULTI-VITAMIN) tablet Take 1 tablet by mouth daily. Vanessa Torres not taking: Reported on 05/30/2021    [provider]  ondansetron (ZOFRAN ODT) 4 MG disintegrating tablet Take 1 tablet (4 mg total) by mouth every 8 (eight) hours as needed for nausea or vomiting. Vanessa Torres not taking: Reported on 05/30/2021 09/30/20   Paulette Blanch, MD    Physical Exam Vitals: Blood pressure 110/70, height 6' (1.829 m), weight 178 lb 14.4 oz (81.1 kg), last menstrual period 12/22/2015.  Physical Exam Constitutional:      Appearance: She is well-developed.  Genitourinary:     Genitourinary Comments: External: Normal appearing vulva. No lesions noted.  Speculum examination: Normal appearing cervix.  Tissue is erythematous and skin of the vagina is punctated with bright red blood    Bimanual examination: Uterus midline, non-tender, normal in size, shape and contour.  No CMT. No adnexal masses. No adnexal tenderness. Pelvis not fixed.  Breast Exam: breast equal without skin changes, nipple discharge, breast lump or enlarged lymph nodes   HENT:     Head: Normocephalic and atraumatic.  Neck:     Thyroid: No thyromegaly.   Cardiovascular:     Rate and Rhythm: Normal rate and regular rhythm.     Heart sounds: Normal heart sounds.  Pulmonary:     Effort: Pulmonary effort is normal.     Breath sounds: Normal breath sounds.  Abdominal:     General: Bowel sounds are normal. There is no distension.     Palpations: Abdomen is soft. There is no mass.  Musculoskeletal:     Cervical back: Neck supple.  Neurological:     Mental Status: She is alert and oriented to person, place, and time.  Skin:    General: Skin is warm and dry.  Psychiatric:        Behavior: Behavior normal.        Thought Content: Thought content normal.        Judgment: Judgment normal.  Vitals reviewed.   Endometrial Biopsy After discussion with the Vanessa Torres regarding her uterine bleeding I recommended that she proceed with an endometrial biopsy for further diagnosis. The risks, benefits, alternatives, and indications for an endometrial biopsy were discussed with the Vanessa Torres in detail. She understood the risks including infection, bleeding, cervical laceration and uterine perforation.  Verbal consent was obtained.   PROCEDURE NOTE:  P[place]elle endometrial biopsy was performed using aseptic technique with iodine preparation. The uterus was sounded to a length of 8 cm.  Adequate sampling was obtained with minimal blood loss.  The Vanessa Torres tolerated the procedure well.  Disposition will be pending pathology.  Female chaperone present for pelvic and breast  portions of the physical exam  Assessment: 51 y.o. G2P0020 routine annual exam  Plan: Problem List Items Addressed This Visit   None Visit Diagnoses     Encounter for annual routine gynecological examination    -  Primary   Cervical cancer screening       Relevant Orders   Cytology - PAP   Breast cancer screening by mammogram       Relevant Orders   MM 3D SCREEN BREAST BILATERAL   Postmenopausal bleeding       Relevant Orders   US PELVIC  COMPLETE WITH TRANSVAGINAL   Surgical pathology        1) Mammogram - recommend yearly screening mammogram.  Mammogram Was ordered today  2) STI screening was offered and accepted  3) pap smear today  4) Colonoscopy -- repeat in 2024 for 5 year follow up after adenoma.  5) Routine healthcare maintenance including cholesterol, diabetes screening discussed managed by PCP  6) Osteoporosis screening - not at increased risk  7) Vaginal dryness and dyspareunia as well as postmenopausal bleeding.  Endometrial biopsy performed today.  Follow-up pending pathology result.  Reviewed over-the-counter vaginal moisturizers.  Encourage pelvic rest for 2 weeks as her bottom heals.  Follow-up planned. Pelvic US ordered  Adrian Prows MD, [place], [facility] 05/30/2021 2:24 PM

## 2021-06-01 ENCOUNTER — Encounter: Payer: Self-pay | Admitting: Obstetrics

## 2021-06-01 LAB — SURGICAL PATHOLOGY

## 2021-06-04 LAB — CYTOLOGY - PAP
Chlamydia: NEGATIVE
Comment: NEGATIVE
Comment: NEGATIVE
Comment: NEGATIVE
Comment: NORMAL
Diagnosis: NEGATIVE
High risk HPV: NEGATIVE
Neisseria Gonorrhea: NEGATIVE
Trichomonas: NEGATIVE

## 2021-06-08 ENCOUNTER — Telehealth: Payer: Self-pay

## 2021-06-08 NOTE — Telephone Encounter (Signed)
Patient r/s to 06/20/21 with CRS

## 2021-06-08 NOTE — Telephone Encounter (Signed)
Contacted patient via phone. Left voicemail for patient to call back to confirm scheduled appointment. 06/19/21 with CRS

## 2021-06-08 NOTE — Telephone Encounter (Signed)
Contacted patient via phone. Left voicemail for patient to call back to scheduled ultrasound follow up after 06/12/21 with Dr. Adrian Prows.

## 2021-06-12 ENCOUNTER — Ambulatory Visit
Admission: RE | Admit: 2021-06-12 | Discharge: 2021-06-12 | Disposition: A | Discharge status: Home / Self Care | Payer: BC Managed Care – PPO | Source: Ambulatory Visit | Attending: Obstetrics and Gynecology | Admitting: Obstetrics and Gynecology

## 2021-06-12 DIAGNOSIS — N950 Postmenopausal bleeding: Secondary | ICD-10-CM | POA: Diagnosis present

## 2021-06-19 ENCOUNTER — Ambulatory Visit: Payer: BC Managed Care – PPO | Admitting: Obstetrics and Gynecology

## 2021-06-20 ENCOUNTER — Other Ambulatory Visit: Payer: Self-pay

## 2021-06-20 ENCOUNTER — Encounter: Payer: Self-pay | Admitting: Obstetrics and Gynecology

## 2021-06-20 ENCOUNTER — Ambulatory Visit: Payer: BC Managed Care – PPO | Admitting: Obstetrics and Gynecology

## 2021-06-20 VITALS — BP 100/70 | Ht 72.0 in | Wt 181.0 lb

## 2021-06-20 DIAGNOSIS — N941 Unspecified dyspareunia: Secondary | ICD-10-CM

## 2021-06-20 NOTE — Patient Instructions (Signed)
° ° °  Vaginal/ Vulvar Moisturizer Use 3-5 times a week at bedtime  Hyalo Gyn Revaree Replens Isaiah Blakes Key-E suppositories Vitamin E oil, olive oil, coconut oil   Water-based Lubricants  Astroglide KY Jelly  Luvena Aquagel  Silicone- based Lubricants Pjur PINK Astroglide silicone Uberlube

## 2021-06-20 NOTE — Progress Notes (Signed)
Patient ID: Vanessa Torres, female   DOB: [birth year], 51 y.o.   MRN: [id number]  Reason for Consult: Follow-up   Referred by No ref. provider found  Subjective:     HPI:  Vanessa Torres is a 51 y.o. female she is following up after evaluation for postmenopausal bleeding.  She reports that she used over-the-counter boric acid suppositories.  She reports that her bottom has been feeling better.  She has had no further bouts of vaginal bleeding.  She had a pelvic ultrasound which showed multiple uterine fibroids.  Gynecological History  Patient's last menstrual period was 12/22/2015.  Past Medical History:  Diagnosis Date   Asthma    exercise induced in college    Brain aneurysm    noted MRI 2004   Collagen vascular disease ([place])    Depression    situational   DVT (deep vein thrombosis) in pregnancy 12/2015   left calf   Fibroid    GERD (gastroesophageal reflux disease)    Herpes genitalia 1992   History of Papanicolaou smear of cervix 12/27/11; 04/18/16   -/-; neg   HSV-2 (herpes simplex virus 2) infection    Hyperlipidemia    Lipoma    left shoulder removed    Ovarian cyst    Premature ovarian failure 2013   Dr. Cristino Martes, GYN. Symptoms have resolved   Pulmonary embolism ([place]) 11/2015   with DVT per pt provoked on international 8 hr flight and was on OCP   Rheumatoid arthritis ([place])    rheumatoid dx 2015 f/u rheumatology Dr. Rosalia Hammers [facility] worse hands, feet, L thumb   TMJ (dislocation of temporomandibular joint)    Family History  Problem Relation Age of Onset   Cancer Father        prostate cancer died 89   Hyperlipidemia Father    Hypertension Father    Diabetes Father    Heart disease Father        Died of HF   Hyperlipidemia Brother    Heart disease Maternal Grandmother        CAD - died   Early death Mother 28       Died of complication from double pna   Bronchitis Brother        not smoker    Cancer Paternal Grandfather        prostate   Breast  cancer Neg Hx    Past Surgical History:  Procedure Laterality Date   APPENDECTOMY  1997   COLONOSCOPY WITH PROPOFOL N/A 12/24/2017   Procedure: COLONOSCOPY WITH PROPOFOL;  Surgeon: Virgel Manifold, MD;  Location: [facility] ENDOSCOPY;  Service: Endoscopy;  Laterality: N/A;   FOOT SURGERY Right 11/2019   [place], [place]   INDUCED ABORTION  05/20/1990; 05/21/1995   x2   KNEE SURGERY Bilateral    x2 each knee   LIPOMA EXCISION  11/2015   back   WISDOM TOOTH EXTRACTION      Short Social History:  Social History   Tobacco Use   Smoking status: Never   Smokeless tobacco: Never  Substance Use Topics   Alcohol use: No    Allergies  Allergen Reactions   Molds & Smuts     Congestion in the nose and chest    Current Outpatient Medications  Medication Sig Dispense Refill   acetaminophen (TYLENOL) 500 MG tablet Take 1 tablet (500 mg total) by mouth every 8 (eight) hours as needed (pain). (not to exceed 8 tabs/day) 90 tablet 0  EPINEPHrine 0.3 mg/0.3 mL IJ SOAJ injection Inject 0.3 mg into the muscle as needed for anaphylaxis. 1 each 0   meclizine (ANTIVERT) 25 MG tablet Take 1 tablet (25 mg total) by mouth 3 (three) times daily as needed for dizziness or nausea. 20 tablet 0   methotrexate (RHEUMATREX) 2.5 MG tablet Take 8 tablets by mouth once a week.     Multiple Vitamin (MULTI-VITAMIN) tablet Take 1 tablet by mouth daily.     ondansetron (ZOFRAN ODT) 4 MG disintegrating tablet Take 1 tablet (4 mg total) by mouth every 8 (eight) hours as needed for nausea or vomiting. 20 tablet 0   valACYclovir (VALTREX) 1000 MG tablet Take one tablet by mouth daily as needed. 30 tablet 2   etanercept (ENBREL) 50 MG/ML injection Inject 50 mg into the skin once a week.     Current Facility-Administered Medications  Medication Dose Route Frequency Provider Last Rate Last Admin   cetirizine (ZYRTEC) tablet 10 mg  10 mg Oral Daily Ratcliffe, Heather R, PA-C   10 mg at 02/01/20 0840    Review of Systems   Constitutional: Negative for chills, fatigue, fever and unexpected weight change.  HENT: Negative for trouble swallowing.  Eyes: Negative for loss of vision.  Respiratory: Negative for cough, shortness of breath and wheezing.  Cardiovascular: Negative for chest pain, leg swelling, palpitations and syncope.  GI: Negative for abdominal pain, blood in stool, diarrhea, nausea and vomiting.  GU: Negative for difficulty urinating, dysuria, frequency and hematuria.  Musculoskeletal: Negative for back pain, leg pain and joint pain.  Skin: Negative for rash.  Neurological: Negative for dizziness, headaches, light-headedness, numbness and seizures.  Psychiatric: Negative for behavioral problem, confusion, depressed mood and sleep disturbance.       Objective:  Objective   Vitals:   06/20/21 1443  BP: 100/70  Weight: 181 lb (82.1 kg)  Height: 6' (1.829 m)   Body mass index is 24.55 kg/m.  Physical Exam Vitals and nursing note reviewed. Exam conducted with a chaperone present.  Constitutional:      Appearance: Normal appearance. She is well-developed.  HENT:     Head: Normocephalic and atraumatic.  Eyes:     Extraocular Movements: Extraocular movements intact.     Pupils: Pupils are equal, round, and reactive to light.  Cardiovascular:     Rate and Rhythm: Normal rate and regular rhythm.  Pulmonary:     Effort: Pulmonary effort is normal. No respiratory distress.     Breath sounds: Normal breath sounds.  Abdominal:     General: Abdomen is flat.     Palpations: Abdomen is soft.  Genitourinary:    Comments: External: Normal appearing vulva. No lesions noted.  Speculum examination: Normal appearing cervix.  Vaginal wall have healed well Musculoskeletal:        General: No signs of injury.  Skin:    General: Skin is warm and dry.  Neurological:     Mental Status: She is alert and oriented to person, place, and time.  Psychiatric:        Behavior: Behavior normal.        Thought  Content: Thought content normal.        Judgment: Judgment normal.    Assessment/Plan:     51 year old with postmenopausal bleeding.  Endometrial biopsy was normal.  Patient reports that she is generally unbothered by her fibroid uterus and does not desire intervention or treatment at this time.  We reviewed vaginal moisturizers and lubricants that she  can use.  She declines hormonal prescriptions at this time.  Follow-up as needed for any dyspareunia or vaginal bleeding after intercourse.  More than 10 minutes were spent face to face with the patient in the room, reviewing the medical record, labs and images, and coordinating care for the patient. The plan of management was discussed in detail and counseling was provided.    Adrian Prows MD [facility], [facility] 06/20/2021 3:00 PM

## 2021-06-29 ENCOUNTER — Encounter (INDEPENDENT_AMBULATORY_CARE_PROVIDER_SITE_OTHER): Payer: BC Managed Care – PPO | Admitting: Vascular Surgery

## 2021-07-30 ENCOUNTER — Ambulatory Visit
Admission: RE | Admit: 2021-07-30 | Discharge: 2021-07-30 | Disposition: A | Discharge status: Home / Self Care | Payer: BC Managed Care – PPO | Source: Ambulatory Visit | Attending: Obstetrics and Gynecology | Admitting: Obstetrics and Gynecology

## 2021-07-30 ENCOUNTER — Other Ambulatory Visit: Payer: Self-pay

## 2021-07-30 DIAGNOSIS — Z1231 Encounter for screening mammogram for malignant neoplasm of breast: Secondary | ICD-10-CM | POA: Insufficient documentation

## 2021-09-19 DIAGNOSIS — M059 Rheumatoid arthritis with rheumatoid factor, unspecified: Principal | ICD-10-CM

## 2021-11-13 ENCOUNTER — Ambulatory Visit
Admit: 2021-11-13 | Discharge: 2021-11-14 | Payer: PRIVATE HEALTH INSURANCE | Attending: Rheumatology | Primary: Rheumatology

## 2021-11-13 DIAGNOSIS — Z79631 Methotrexate, long term, current use: Principal | ICD-10-CM

## 2021-11-13 DIAGNOSIS — M059 Rheumatoid arthritis with rheumatoid factor, unspecified: Principal | ICD-10-CM

## 2021-11-13 MED ORDER — METHOTREXATE SODIUM 2.5 MG TABLET
ORAL_TABLET | ORAL | 3 refills | 105 days | Status: CP
Start: 2021-11-13 — End: ?

## 2021-11-13 MED ORDER — EPINEPHRINE 0.3 MG/0.3 ML INJECTION, AUTO-INJECTOR
Freq: Once | INTRAMUSCULAR | 0 refills | 0 days | Status: CP | PRN
Start: 2021-11-13 — End: 2022-11-13

## 2022-03-18 ENCOUNTER — Encounter (INDEPENDENT_AMBULATORY_CARE_PROVIDER_SITE_OTHER): Payer: Self-pay

## 2022-05-06 DIAGNOSIS — M059 Rheumatoid arthritis with rheumatoid factor, unspecified: Principal | ICD-10-CM

## 2022-05-06 MED ORDER — ENBREL SURECLICK 50 MG/ML (1 ML) SUBCUTANEOUS PEN INJECTOR
SUBCUTANEOUS | 3 refills | 84 days | Status: CP
Start: 2022-05-06 — End: 2022-05-06

## 2022-07-08 ENCOUNTER — Ambulatory Visit (INDEPENDENT_AMBULATORY_CARE_PROVIDER_SITE_OTHER): Payer: Self-pay | Admitting: Physician Assistant

## 2022-07-08 ENCOUNTER — Encounter: Payer: Self-pay | Admitting: Physician Assistant

## 2022-07-08 VITALS — BP 114/64 | HR 91 | Temp 98.0°F

## 2022-07-08 DIAGNOSIS — Z20822 Contact with and (suspected) exposure to covid-19: Secondary | ICD-10-CM

## 2022-07-08 DIAGNOSIS — J0110 Acute frontal sinusitis, unspecified: Secondary | ICD-10-CM

## 2022-07-08 LAB — POC SOFIA 2 FLU + SARS ANTIGEN FIA
Influenza A, POC: NEGATIVE
Influenza B, POC: NEGATIVE
SARS Coronavirus 2 Ag: NEGATIVE

## 2022-07-08 MED ORDER — AMOXICILLIN-POT CLAVULANATE 875-125 MG PO TABS: 1.0000 | ORAL_TABLET | Freq: Two times a day (BID) | ORAL | 0 refills | Status: AC

## 2022-07-08 NOTE — Progress Notes (Signed)
[facility] [address], [place] [id number]   Office Visit Note  Patient Name: Vanessa Torres Date of Birth [id number]  Medical Record number [id number]  Date of Service: 07/08/2022  Chief Complaint  Patient presents with   Covid Exposure    Was exposed to [place]. Started Tue. Sinus pressure, headache, ST, coughing up green mucus. Started feeling worse Fri. Sat and Sun. No fever or BA.     52 y/o F presents to the clinic for c/o sore throat, sinus pressure, cough x6 days and feeling worse. Worse over the weekend. Denies CP, SOB, wheezing, fever, chills, or body aches. +known exposure to flu and covid last week.       Current Medication:  Outpatient Encounter Medications as of 07/08/2022  Medication Sig   amoxicillin-clavulanate (AUGMENTIN) 875-125 MG tablet Take 1 tablet by mouth 2 (two) times daily for 5 days.   methotrexate (RHEUMATREX) 2.5 MG tablet Take 8 tablets by mouth once a week.   Multiple Vitamin (MULTI-VITAMIN) tablet Take 1 tablet by mouth daily.   valACYclovir (VALTREX) 1000 MG tablet Take one tablet by mouth daily as needed.   acetaminophen (TYLENOL) 500 MG tablet Take 1 tablet (500 mg total) by mouth every 8 (eight) hours as needed (pain). (not to exceed 8 tabs/day) (Patient not taking: Reported on 07/08/2022)   EPINEPHrine 0.3 mg/0.3 mL IJ SOAJ injection Inject 0.3 mg into the muscle as needed for anaphylaxis.   etanercept (ENBREL) 50 MG/ML injection Inject 50 mg into the skin once a week.   meclizine (ANTIVERT) 25 MG tablet Take 1 tablet (25 mg total) by mouth 3 (three) times daily as needed for dizziness or nausea. (Patient not taking: Reported on 07/08/2022)   ondansetron (ZOFRAN ODT) 4 MG disintegrating tablet Take 1 tablet (4 mg total) by mouth every 8 (eight) hours as needed for nausea or vomiting. (Patient not taking: Reported on 07/08/2022)   Facility-Administered Encounter Medications as of 07/08/2022  Medication   cetirizine (ZYRTEC)  tablet 10 mg      Medical History: Past Medical History:  Diagnosis Date   Asthma    exercise induced in college    Brain aneurysm    noted MRI 2004   Collagen vascular disease ([place])    Depression    situational   DVT (deep vein thrombosis) in pregnancy 12/2015   left calf   Fibroid    GERD (gastroesophageal reflux disease)    Herpes genitalia 1992   History of Papanicolaou smear of cervix 12/27/11; 04/18/16   -/-; neg   HSV-2 (herpes simplex virus 2) infection    Hyperlipidemia    Lipoma    left shoulder removed    Ovarian cyst    Premature ovarian failure 2013   Dr. Cristino Martes, GYN. Symptoms have resolved   Pulmonary embolism ([place]) 11/2015   with DVT per pt provoked on international 8 hr flight and was on OCP   Rheumatoid arthritis ([place])    rheumatoid dx 2015 f/u rheumatology Dr. Rosalia Hammers [facility] worse hands, feet, L thumb   TMJ (dislocation of temporomandibular joint)      Vital Signs: BP 114/64 (BP Location: Left Arm, Patient Position: Sitting, Cuff Size: Normal)   Pulse 91   Temp 98 F (36.7 C) (Tympanic)   LMP 12/22/2015   SpO2 98%    Review of Systems  Constitutional: Negative.   HENT:  Positive for congestion, sinus pressure, sinus pain and sore throat. Negative for trouble swallowing.  Respiratory: Negative.    Cardiovascular: Negative.   Neurological:  Positive for headaches. Negative for dizziness.    Physical Exam Constitutional:      Appearance: Normal appearance.  HENT:     Head: Atraumatic.     Right Ear: Tympanic membrane, ear canal and external ear normal.     Left Ear: Tympanic membrane, ear canal and external ear normal.     Nose:     Right Turbinates: Enlarged and swollen.     Left Turbinates: Enlarged and swollen.     Right Sinus: Frontal sinus tenderness present.     Left Sinus: Frontal sinus tenderness present.     Mouth/Throat:     Mouth: Mucous membranes are moist.     Pharynx: Oropharynx is clear.  Eyes:     Extraocular Movements:  Extraocular movements intact.  Cardiovascular:     Rate and Rhythm: Normal rate and regular rhythm.  Pulmonary:     Effort: Pulmonary effort is normal.     Breath sounds: Normal breath sounds.  Musculoskeletal:     Cervical back: Neck supple.  Skin:    General: Skin is warm.  Neurological:     Mental Status: She is alert.  Psychiatric:        Mood and Affect: Mood normal.        Behavior: Behavior normal.        Thought Content: Thought content normal.        Judgment: Judgment normal.       Assessment/Plan:  1. Acute non-recurrent frontal sinusitis - amoxicillin-clavulanate (AUGMENTIN) 875-125 MG tablet; Take 1 tablet by mouth 2 (two) times daily for 5 days.  Dispense: 10 tablet; Refill: 0  2. Exposure to COVID-19 virus - POC SOFIA 2 FLU + SARS ANTIGEN FIA  Reviewed negative covid and flu test with patient. Increase fluids Start a humidifier Take otc oral Mucinex-D for cough and congestion Start oral antibiotic, Augmentin as prescribed if symptoms don't improve. Pt verbalized understanding and in agreement.   General Counseling: mikyah tonthat understanding of the findings of todays visit and agrees with plan of treatment. I have discussed any further diagnostic evaluation that may be needed or ordered today. We also reviewed her medications today. she has been encouraged to call the office with any questions or concerns that should arise related to todays visit.    Time spent:20 [place], [place] Physician Assistant

## 2022-07-11 ENCOUNTER — Ambulatory Visit (INDEPENDENT_AMBULATORY_CARE_PROVIDER_SITE_OTHER): Payer: Self-pay | Admitting: Adult Health

## 2022-07-11 DIAGNOSIS — R051 Acute cough: Secondary | ICD-10-CM

## 2022-07-11 MED ORDER — ALBUTEROL SULFATE HFA 108 (90 BASE) MCG/ACT IN AERS: 2.0000 | INHALATION_SPRAY | Freq: Four times a day (QID) | RESPIRATORY_TRACT | 0 refills | Status: DC | PRN

## 2022-07-11 NOTE — Progress Notes (Signed)
Virtual Visit via Video Note  I connected with Vanessa Torres on 07/11/22 at  1:00 PM EST by a video enabled telemedicine application and verified that I am speaking with the correct person using two identifiers.  Location: Patient: home Provider: office   I discussed the limitations of evaluation and management by telemedicine and the availability of in person appointments. The patient expressed understanding and agreed to proceed.  History of Present Illness: Patient was seen 3 days ago, started on Augmentin.  She continues to have cough and some sob.  She is requesting Albuterol at this time.    Observations/Objective: Cough on phone  Assessment and Plan: 1. Acute cough Use albuterol 2 puffs three times daily, for two weeks.  Then use per instructions.  Follow up with clinic tomorrow if no improvement. Would consider CXR tomorrow.  - albuterol (VENTOLIN HFA) 108 (90 Base) MCG/ACT inhaler; Inhale 2 puffs into the lungs every 6 (six) hours as needed for wheezing or shortness of breath.  Dispense: 8 g; Refill: 0     I discussed the assessment and treatment plan with the patient. The patient was provided an opportunity to ask questions and all were answered. The patient agreed with the plan and demonstrated an understanding of the instructions.   The patient was advised to call back or seek an in-person evaluation if the symptoms worsen or if the condition fails to improve as anticipated.  I provided 15 minutes of non-face-to-face time during this encounter.   Kendell Bane, NP

## 2022-08-02 ENCOUNTER — Other Ambulatory Visit: Payer: Self-pay | Admitting: Adult Health

## 2022-08-02 DIAGNOSIS — R051 Acute cough: Secondary | ICD-10-CM

## 2022-11-19 ENCOUNTER — Ambulatory Visit
Admit: 2022-11-19 | Discharge: 2022-11-20 | Payer: PRIVATE HEALTH INSURANCE | Attending: Rheumatology | Primary: Rheumatology

## 2022-11-19 DIAGNOSIS — Z79631 Methotrexate, long term, current use: Principal | ICD-10-CM

## 2022-11-19 DIAGNOSIS — M059 Rheumatoid arthritis with rheumatoid factor, unspecified: Principal | ICD-10-CM

## 2022-11-19 DIAGNOSIS — M0579 Rheumatoid arthritis with rheumatoid factor of multiple sites without organ or systems involvement: Principal | ICD-10-CM

## 2022-11-19 MED ORDER — FOLIC ACID 1 MG TABLET
ORAL_TABLET | Freq: Every day | ORAL | 3 refills | 90 days | Status: CP
Start: 2022-11-19 — End: ?

## 2022-11-19 MED ORDER — METHOTREXATE SODIUM 2.5 MG TABLET
ORAL_TABLET | ORAL | 3 refills | 105 days | Status: CP
Start: 2022-11-19 — End: ?

## 2022-11-25 DIAGNOSIS — D126 Benign neoplasm of colon, unspecified: Principal | ICD-10-CM

## 2022-12-18 ENCOUNTER — Encounter: Payer: Self-pay | Admitting: Physician Assistant

## 2022-12-18 ENCOUNTER — Ambulatory Visit (INDEPENDENT_AMBULATORY_CARE_PROVIDER_SITE_OTHER): Payer: Self-pay | Admitting: Physician Assistant

## 2022-12-18 VITALS — BP 100/70 | HR 67 | Temp 98.1°F | Ht 72.0 in | Wt 168.0 lb

## 2022-12-18 DIAGNOSIS — L509 Urticaria, unspecified: Secondary | ICD-10-CM

## 2022-12-18 MED ORDER — METHYLPREDNISOLONE 4 MG PO TBPK: ORAL_TABLET | ORAL | 0 refills | Status: DC

## 2022-12-18 MED ORDER — HYDROXYZINE HCL 10 MG PO TABS: 10.0000 mg | ORAL_TABLET | Freq: Three times a day (TID) | ORAL | 0 refills | Status: DC | PRN

## 2022-12-18 NOTE — Progress Notes (Signed)
[facility] [address] [place], [place] [address]   Office Visit Note  Patient Name: Vanessa Torres Date of Birth [id number]  Medical Record number [id number]  Date of Service: 12/18/2022  Chief Complaint  Patient presents with   Acute Visit    Patient reports redness/swelling on R arm today. She states she keeps getting bit by something in her home. She had previous bites to her L buttock and the posterior side of her R leg which have been improving over time. Bites have been itchy. No one else lives in the home with her but she does have a dog. Denies fever or difficulty breathing.     52 y/o F presents to the clinic for c/o red bump with itching  over the right elbow x 2 days. She's had other red bumps over other parts of the body including behind R knee, L buttock, upper back which all resolved spontaneously, but now a new bump on the R elbow. No known triggers. Denies changes in diet, hygiene products, medications, or otc supplements. Has been applying Benadryl spray to help not itch.       Current Medication:  Outpatient Encounter Medications as of 12/18/2022  Medication Sig   acetaminophen (TYLENOL) 500 MG tablet Take 1 tablet (500 mg total) by mouth every 8 (eight) hours as needed (pain). (not to exceed 8 tabs/day)   albuterol (VENTOLIN HFA) 108 (90 Base) MCG/ACT inhaler Inhale 2 puffs into the lungs every 6 (six) hours as needed for wheezing or shortness of breath.   EPINEPHrine 0.3 mg/0.3 mL IJ SOAJ injection Inject 0.3 mg into the muscle as needed for anaphylaxis.   etanercept (ENBREL) 50 MG/ML injection Inject 50 mg into the skin once a week.   folic acid (FOLVITE) 1 MG tablet Take 1 tablet by mouth daily.   hydrOXYzine (ATARAX) 10 MG tablet Take 1 tablet (10 mg total) by mouth 3 (three) times daily as needed.   methotrexate (RHEUMATREX) 2.5 MG tablet Take 6 tablets by mouth once a week.   methylPREDNISolone (MEDROL DOSEPAK) 4 MG TBPK tablet Take as directed    Multiple Vitamin (MULTI-VITAMIN) tablet Take 1 tablet by mouth daily.   valACYclovir (VALTREX) 1000 MG tablet Take one tablet by mouth daily as needed.   [DISCONTINUED] meclizine (ANTIVERT) 25 MG tablet Take 1 tablet (25 mg total) by mouth 3 (three) times daily as needed for dizziness or nausea. (Patient not taking: Reported on 07/08/2022)   [DISCONTINUED] ondansetron (ZOFRAN ODT) 4 MG disintegrating tablet Take 1 tablet (4 mg total) by mouth every 8 (eight) hours as needed for nausea or vomiting. (Patient not taking: Reported on 07/08/2022)   [DISCONTINUED] cetirizine (ZYRTEC) tablet 10 mg    No facility-administered encounter medications on file as of 12/18/2022.      Medical History: Past Medical History:  Diagnosis Date   Asthma    exercise induced in college    Brain aneurysm    noted MRI 2004   Collagen vascular disease (HCC)    Depression    situational   DVT (deep vein thrombosis) in pregnancy 12/2015   left calf   Fibroid    GERD (gastroesophageal reflux disease)    Herpes genitalia 1992   History of Papanicolaou smear of cervix 12/27/11; 04/18/16   -/-; neg   HSV-2 (herpes simplex virus 2) infection    Hyperlipidemia    Lipoma    left shoulder removed    Ovarian cyst    Premature ovarian  failure 2013   Dr. Janene Harvey, GYN. Symptoms have resolved   Pulmonary embolism (HCC) 11/2015   with DVT per pt provoked on international 8 hr flight and was on OCP   Rheumatoid arthritis (HCC)    rheumatoid dx 2015 f/u rheumatology Dr. Patrica Duel [facility] worse hands, feet, L thumb   TMJ (dislocation of temporomandibular joint)      Vital Signs: BP 100/70 (BP Location: Left Arm, Patient Position: Sitting, Cuff Size: Normal)   Pulse 67   Temp 98.1 F (36.7 C)   Ht 6' (1.829 m)   Wt 168 lb (76.2 kg)   LMP 12/22/2015   SpO2 98%   BMI 22.78 kg/m    Review of Systems  Skin:  Positive for rash.       + itching    Physical Exam Constitutional:      Appearance: Normal appearance.   HENT:     Head: Normocephalic and atraumatic.     Right Ear: External ear normal.     Left Ear: External ear normal.  Eyes:     Extraocular Movements: Extraocular movements intact.  Skin:    General: Skin is warm and dry.          Comments: R elbow: Single red raised welt without open wound over the lateral aspect of the R elbow.   Neurological:     Mental Status: She is alert and oriented to person, place, and time.  Psychiatric:        Mood and Affect: Mood normal.        Behavior: Behavior normal.       Assessment/Plan:  1. Welt - methylPREDNISolone (MEDROL DOSEPAK) 4 MG TBPK tablet; Take as directed  Dispense: 1 each; Refill: 0 - hydrOXYzine (ATARAX) 10 MG tablet; Take 1 tablet (10 mg total) by mouth 3 (three) times daily as needed.  Dispense: 30 tablet; Refill: 0  Take oral steroids as prescribed. Apply ice packs Apply Sarna lotion May consider taking Hydroxyzine to help not itch or take Benadryl at bedtime.  Unsure of trigger for her welts.  Continue to watch for worsening symptoms. Pt will contact us if symptoms worsen. Pt verbalized understanding and in agreement.    General Counseling: nonya pahl understanding of the findings of todays visit and agrees with plan of treatment. I have discussed any further diagnostic evaluation that may be needed or ordered today. We also reviewed her medications today. she has been encouraged to call the office with any questions or concerns that should arise related to todays visit.    Time spent:20 Minutes    Gilberto Better, [place] Physician Assistant

## 2023-04-25 DIAGNOSIS — M059 Rheumatoid arthritis with rheumatoid factor, unspecified: Principal | ICD-10-CM

## 2023-04-25 MED ORDER — ENBREL SURECLICK 50 MG/ML (1 ML) SUBCUTANEOUS PEN INJECTOR
0 refills | 0 days | Status: CP
Start: 2023-04-25 — End: ?

## 2023-06-10 ENCOUNTER — Ambulatory Visit: Admit: 2023-06-10 | Discharge: 2023-06-10 | Payer: BLUE CROSS/BLUE SHIELD

## 2023-06-10 ENCOUNTER — Ambulatory Visit
Admit: 2023-06-10 | Discharge: 2023-06-10 | Payer: BLUE CROSS/BLUE SHIELD | Attending: Rheumatology | Primary: Rheumatology

## 2023-06-10 DIAGNOSIS — M059 Rheumatoid arthritis with rheumatoid factor, unspecified: Principal | ICD-10-CM

## 2023-06-10 DIAGNOSIS — M0579 Rheumatoid arthritis with rheumatoid factor of multiple sites without organ or systems involvement: Principal | ICD-10-CM

## 2023-06-10 DIAGNOSIS — Z79631 Methotrexate, long term, current use: Principal | ICD-10-CM

## 2023-06-10 MED ORDER — METHOTREXATE SODIUM 2.5 MG TABLET
ORAL_TABLET | ORAL | 3 refills | 105.00 days | Status: CP
Start: 2023-06-10 — End: ?

## 2023-06-10 MED ORDER — FOLIC ACID 1 MG TABLET
ORAL_TABLET | Freq: Every day | ORAL | 3 refills | 90.00 days | Status: CP
Start: 2023-06-10 — End: ?

## 2023-06-10 MED ORDER — ENBREL SURECLICK 50 MG/ML (1 ML) SUBCUTANEOUS PEN INJECTOR
SUBCUTANEOUS | 1 refills | 84.00 days | Status: CP
Start: 2023-06-10 — End: ?

## 2023-07-01 DIAGNOSIS — M059 Rheumatoid arthritis with rheumatoid factor, unspecified: Principal | ICD-10-CM

## 2023-07-01 MED ORDER — ENBREL SURECLICK 50 MG/ML (1 ML) SUBCUTANEOUS PEN INJECTOR
SUBCUTANEOUS | 1 refills | 84.00 days | Status: CP
Start: 2023-07-01 — End: ?

## 2023-07-02 ENCOUNTER — Other Ambulatory Visit: Payer: Self-pay

## 2023-07-02 ENCOUNTER — Ambulatory Visit (INDEPENDENT_AMBULATORY_CARE_PROVIDER_SITE_OTHER): Payer: Self-pay | Admitting: Oncology

## 2023-07-02 ENCOUNTER — Encounter: Payer: Self-pay | Admitting: Oncology

## 2023-07-02 VITALS — BP 110/82 | HR 85 | Temp 98.5°F | Ht 72.0 in | Wt 175.0 lb

## 2023-07-02 DIAGNOSIS — J029 Acute pharyngitis, unspecified: Secondary | ICD-10-CM

## 2023-07-02 DIAGNOSIS — R059 Cough, unspecified: Secondary | ICD-10-CM

## 2023-07-02 LAB — POC SOFIA 2 FLU + SARS ANTIGEN FIA
Influenza A, POC: NEGATIVE
Influenza B, POC: NEGATIVE
SARS Coronavirus 2 Ag: NEGATIVE

## 2023-07-02 MED ORDER — PREDNISONE 20 MG PO TABS: 40.0000 mg | ORAL_TABLET | Freq: Every day | ORAL | 0 refills | Status: DC

## 2023-07-02 NOTE — Progress Notes (Signed)
[facility] [address] [place], [place] [address] Phone: [phone] Fax: [phone]   Office Visit Note  Patient Name: Vanessa Torres  Date of [id number]  Med Rec number [id number]  Date of Service: 07/02/2023  Molds & smuts  Chief Complaint  Patient presents with   Sick visit    Patient c/o sore throat and cough with thick yellow-green sputum. Symptoms began yesterday and have been worsening over time. She has been taking Mucinex.      HPI Patient is an 53 y.o. student here for complaints of fatigue, st and cough that started yesterday. No sick contacts. Coachs womens basketball. Has seen quite a bit of flu. No fevers.   Has been taking mucinex and wellness complete.   No flu contact. NKDA.  Current Medication:  Outpatient Encounter Medications as of 07/02/2023  Medication Sig   acetaminophen (TYLENOL) 500 MG tablet Take 1 tablet (500 mg total) by mouth every 8 (eight) hours as needed (pain). (not to exceed 8 tabs/day)   EPINEPHrine 0.3 mg/0.3 mL IJ SOAJ injection Inject 0.3 mg into the muscle as needed for anaphylaxis.   etanercept (ENBREL) 50 MG/ML injection Inject 50 mg into the skin once a week.   folic acid (FOLVITE) 1 MG tablet Take 1 tablet by mouth daily.   methotrexate (RHEUMATREX) 2.5 MG tablet Take 6 tablets by mouth once a week.   Multiple Vitamin (MULTI-VITAMIN) tablet Take 1 tablet by mouth daily.   valACYclovir (VALTREX) 1000 MG tablet Take one tablet by mouth daily as needed.   [DISCONTINUED] albuterol (VENTOLIN HFA) 108 (90 Base) MCG/ACT inhaler Inhale 2 puffs into the lungs every 6 (six) hours as needed for wheezing or shortness of breath.   [DISCONTINUED] hydrOXYzine (ATARAX) 10 MG tablet Take 1 tablet (10 mg total) by mouth 3 (three) times daily as needed.   [DISCONTINUED] methylPREDNISolone (MEDROL DOSEPAK) 4 MG TBPK tablet Take as directed   No facility-administered encounter medications on file as of 07/02/2023.      Medical  History: Past Medical History:  Diagnosis Date   Asthma    exercise induced in college    Brain aneurysm    noted MRI 2004   Collagen vascular disease (HCC)    Depression    situational   DVT (deep vein thrombosis) in pregnancy 12/2015   left calf   Fibroid    GERD (gastroesophageal reflux disease)    Herpes genitalia 1992   History of Papanicolaou smear of cervix 12/27/11; 04/18/16   -/-; neg   HSV-2 (herpes simplex virus 2) infection    Hyperlipidemia    Lipoma    left shoulder removed    Ovarian cyst    Premature ovarian failure 2013   Dr. Janene Harvey, GYN. Symptoms have resolved   Pulmonary embolism (HCC) 11/2015   with DVT per pt provoked on international 8 hr flight and was on OCP   Rheumatoid arthritis (HCC)    rheumatoid dx 2015 f/u rheumatology Dr. Patrica Duel [facility] worse hands, feet, L thumb   TMJ (dislocation of temporomandibular joint)      Vital Signs: BP 110/82   Pulse 85   Temp 98.5 F (36.9 C) (Tympanic)   Ht 6' (1.829 m)   Wt 175 lb (79.4 kg)   LMP 12/22/2015   SpO2 99%   BMI 23.73 kg/m   ROS: As per HPI.  All other pertinent ROS negative.     Review of Systems  Constitutional:  Positive for fatigue. Negative for  chills, diaphoresis and fever.  HENT:  Positive for congestion and sore throat. Negative for ear pain, sinus pressure and sinus pain.   Respiratory:  Positive for cough.   Gastrointestinal:  Negative for nausea and vomiting.  Musculoskeletal:  Negative for myalgias.  Neurological:  Negative for dizziness and light-headedness.   Physical Exam Constitutional:      Appearance: She is ill-appearing.  HENT:     Right Ear: A middle ear effusion is present. Tympanic membrane is bulging.     Left Ear: A middle ear effusion is present.     Nose: Mucosal edema, congestion and rhinorrhea present.     Right Turbinates: Swollen.     Left Turbinates: Swollen.     Right Sinus: Maxillary sinus tenderness present.     Left Sinus: Maxillary sinus tenderness  present.     Mouth/Throat:     Mouth: Mucous membranes are moist.     Pharynx: Postnasal drip present.     Tonsils: No tonsillar exudate. 0 on the right. 0 on the left.  Pulmonary:     Effort: Pulmonary effort is normal.     Breath sounds: Normal breath sounds.  Lymphadenopathy:     Head:     Right side of head: Submandibular and tonsillar adenopathy present.     Left side of head: Submandibular and tonsillar adenopathy present.  Neurological:     Mental Status: She is alert.     No results found for this or any previous visit (from the past 24 hours).  Assessment/Plan: 1. Cough, unspecified type (Primary) -Likely secondary to postnasal drainage.  Pulmonary exam was benign.  Recommend Flonase 2 sprays each nostril and a decongestant such as DayQuil and NyQuil.  May take ibuprofen and Tylenol as needed.  - POC SOFIA 2 FLU + SARS ANTIGEN FIA - predniSONE (DELTASONE) 20 MG tablet; Take 2 tablets (40 mg total) by mouth daily with breakfast.  Dispense: 10 tablet; Refill: 0  2. Sore throat -Flu and COVID testing negative. -Mild tonsillar swelling without exudate.  Postnasal drainage is present.  Mild cervical lymphadenopathy but otherwise unremarkable.  Recommend prednisone 40 mg each morning with breakfast to help with bilateral middle ear effusion and tonsillar swelling.  We discussed returning to clinic should her symptoms not improve over the next 3 to 4 days.  - POC SOFIA 2 FLU + SARS ANTIGEN FIA - predniSONE (DELTASONE) 20 MG tablet; Take 2 tablets (40 mg total) by mouth daily with breakfast.  Dispense: 10 tablet; Refill: 0   General Counseling: Vanessa Torres verbalizes understanding of the findings of todays visit and agrees with plan of treatment. I have discussed any further diagnostic evaluation that may be needed or ordered today. We also reviewed her medications today. she has been encouraged to call the office with any questions or concerns that should arise related to todays  visit.   Orders Placed This Encounter  Procedures   POC SOFIA 2 FLU + SARS ANTIGEN FIA    No orders of the defined types were placed in this encounter.   I spent 20 minutes dedicated to the care of this patient (face-to-face and non-face-to-face) on the date of the encounter to include what is described in the assessment and plan.   Durenda Hurt, NP 07/02/2023 10:46 AM

## 2023-07-07 ENCOUNTER — Ambulatory Visit (INDEPENDENT_AMBULATORY_CARE_PROVIDER_SITE_OTHER): Payer: Self-pay | Admitting: Oncology

## 2023-07-07 DIAGNOSIS — R051 Acute cough: Secondary | ICD-10-CM

## 2023-07-07 MED ORDER — PREDNISONE 20 MG PO TABS: 40.0000 mg | ORAL_TABLET | Freq: Every day | ORAL | 0 refills | Status: DC

## 2023-07-07 MED ORDER — AMOXICILLIN-POT CLAVULANATE 875-125 MG PO TABS
1.0000 | ORAL_TABLET | Freq: Two times a day (BID) | ORAL | 0 refills | Status: DC
Start: 2023-07-07 — End: 2023-07-10

## 2023-07-07 NOTE — Progress Notes (Signed)
 Virtual Visit via Telephone Note  I connected with Vanessa Torres on 07/07/23 at  3:00 PM EST by telephone and verified that I am speaking with the correct person using two identifiers.  Location: Patient: Home  Provider: Clinic    I discussed the limitations, risks, security and privacy concerns of performing an evaluation and management service by telephone and the availability of in person appointments. I also discussed with the patient that there may be a patient responsible charge related to this service. The patient expressed understanding and agreed to proceed.   History of Present Illness: Patient is a 53 year old female who was evaluated last week on 07/02/2023 for fatigue, sore throat and cough.  She was given a prednisone taper to help with lymphadenopathy.  Cough has worsened  and she is hearing rattling in her chest.  Reports she has been coughing up green and yellow sputum.  Her voice is raspy and it hurts to talk.  States she felt pretty good on Friday and into Saturday and then started feeling poorly Saturday evening Sunday morning.  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  No fevers.  Has been using over-the-counter cough suppressants.  Appetite is low.  Energy levels are low.    Observations/Objective:Review of Systems  Constitutional:  Positive for malaise/fatigue.  HENT:  Positive for sore throat.   Respiratory:  Positive for cough.    Physical Exam Neurological:     Mental Status: She is alert and oriented to person, place, and time.     Assessment and Plan: 1. Acute cough (Primary) We discussed going ahead and treating her for upper respiratory infection with Augmentin and prednisone 40 mg each morning with breakfast x 4 days.  Please take Augmentin twice daily x 10 days.  Take both medications with food.  If no improvement by Thursday would recommend a chest x-ray. Drink plenty of fluids.  Please take OTC  medications as needed for fever, body aches and cough.  Use a decongestant to help with postnasal drainage.  - amoxicillin-clavulanate (AUGMENTIN) 875-125 MG tablet; Take 1 tablet by mouth 2 (two) times daily.  Dispense: 20 tablet; Refill: 0 - predniSONE (DELTASONE) 20 MG tablet; Take 2 tablets (40 mg total) by mouth daily with breakfast.  Dispense: 8 tablet; Refill: 0   Follow Up Instructions: Follow-up as needed or if symptoms worsen or fail to improve.  Recommend   I discussed the assessment and treatment plan with the patient. The patient was provided an opportunity to ask questions and all were answered. The patient agreed with the plan and demonstrated an understanding of the instructions.   The patient was advised to call back or seek an in-person evaluation if the symptoms worsen or if the condition fails to improve as anticipated.  I provided 18 minutes of non-face-to-face time during this encounter.   Mauro Kaufmann, NP

## 2023-07-10 ENCOUNTER — Ambulatory Visit
Admission: RE | Admit: 2023-07-10 | Discharge: 2023-07-10 | Disposition: A | Discharge status: Home / Self Care | Payer: BC Managed Care – PPO | Source: Ambulatory Visit | Attending: Adult Health | Admitting: Adult Health

## 2023-07-10 ENCOUNTER — Encounter: Payer: Self-pay | Admitting: Adult Health

## 2023-07-10 ENCOUNTER — Ambulatory Visit
Admission: RE | Admit: 2023-07-10 | Discharge: 2023-07-10 | Disposition: A | Discharge status: Home / Self Care | Payer: BC Managed Care – PPO | Attending: Adult Health | Admitting: Adult Health

## 2023-07-10 ENCOUNTER — Other Ambulatory Visit: Payer: Self-pay | Admitting: Adult Health

## 2023-07-10 DIAGNOSIS — R051 Acute cough: Secondary | ICD-10-CM | POA: Insufficient documentation

## 2023-07-10 MED ORDER — CEPHALEXIN 500 MG PO CAPS
500.0000 mg | ORAL_CAPSULE | Freq: Two times a day (BID) | ORAL | 0 refills | Status: DC
Start: 2023-07-10 — End: 2023-07-22

## 2023-07-10 NOTE — Progress Notes (Signed)
 Spoke with patient on phone.  Continues to have productive cough Worse cough,  Diarrhea, likely from augmentin   Switch to keflex, and get CXR

## 2023-07-11 ENCOUNTER — Ambulatory Visit: Payer: Self-pay | Admitting: Physician Assistant

## 2023-07-11 DIAGNOSIS — J40 Bronchitis, not specified as acute or chronic: Secondary | ICD-10-CM

## 2023-07-11 MED ORDER — BENZONATATE 200 MG PO CAPS: 200.0000 mg | ORAL_CAPSULE | Freq: Three times a day (TID) | ORAL | 0 refills | Status: DC | PRN

## 2023-07-11 MED ORDER — COMPRESSOR/NEBULIZER MISC: 0 refills | Status: AC

## 2023-07-11 MED ORDER — ALBUTEROL SULFATE HFA 108 (90 BASE) MCG/ACT IN AERS: 1.0000 | INHALATION_SPRAY | RESPIRATORY_TRACT | 0 refills | Status: AC | PRN

## 2023-07-11 NOTE — Progress Notes (Signed)
 Virtual Visit Consent   MIYANI CRONIC, you are scheduled for a virtual visit with a [facility] provider today. Just as with appointments in the office, your consent must be obtained to participate.   I need to obtain your verbal consent now. Are you willing to proceed with your visit today? Vanessa Torres has provided verbal consent on 07/11/2023 for a virtual visit (telephone). [address] PA-C  Date: 07/11/2023 12:33 PM  Virtual Visit via Video Note   I, [address], connected with  Vanessa Torres  ([id number], 1970-12-24) on 07/11/23 at 12:20 PM EST by telephone and verified that I am speaking with the correct person using two identifiers.  Location: Patient: Virtual Visit Location Patient: Home Provider: Virtual Visit Location Provider: Office/Clinic   I discussed the limitations of evaluation and management by telemedicine and the availability of in person appointments. The patient expressed understanding and agreed to proceed.    History of Present Illness: Vanessa Torres is a 53 y.o. and is being seen today for recheck. Pt was seen on 2/12 for cough/URI, given prednisone, didn't improve much, had virtual visit on 2/17 and was given augmentin and prednisone. Yesterday, switched to Keflex and got a CXR which was negative. Still having cough, with "coughing attacks" and hoarseness. Thinks she had a cough syrup with codeine that helped before. Also recalls being given tessalon before.  Has needed an inhaler before, with prior illnesses.  Taking dayquil and nyquil, some help from those. Also using tussin DM.  No fevers, CP, SOB, wheezing, abd pain/n/v, other symptoms.    HPI: HPI  Problems:  Patient Active Problem List   Diagnosis Date Noted   Dyspareunia, female 05/29/2021   Well woman exam with routine gynecological exam 02/21/2020   Microcytic anemia 12/29/2017   Special screening for malignant neoplasms, colon    Benign neoplasm of ascending colon     Eczema 07/02/2017   Nonruptured cerebral aneurysm 07/02/2017   Vitamin D deficiency 07/02/2017   Dislocation of jaw 07/02/2017   Rheumatoid arthritis (HCC) 11/07/2016   Bilateral pulmonary embolism (HCC) 03/17/2016   Elevated rheumatoid factor 06/17/2013   GERD (gastroesophageal reflux disease) 06/17/2013   Fatigue 06/17/2013   Lipoma 06/17/2013   Breast cancer screening 06/17/2013    Allergies:  Allergies  Allergen Reactions   Molds & Smuts     Congestion in the nose and chest   Medications:  Current Outpatient Medications:    acetaminophen (TYLENOL) 500 MG tablet, Take 1 tablet (500 mg total) by mouth every 8 (eight) hours as needed (pain). (not to exceed 8 tabs/day), Disp: 90 tablet, Rfl: 0   cephALEXin (KEFLEX) 500 MG capsule, Take 1 capsule (500 mg total) by mouth 2 (two) times daily., Disp: 20 capsule, Rfl: 0   EPINEPHrine 0.3 mg/0.3 mL IJ SOAJ injection, Inject 0.3 mg into the muscle as needed for anaphylaxis., Disp: 1 each, Rfl: 0   etanercept (ENBREL) 50 MG/ML injection, Inject 50 mg into the skin once a week., Disp: , Rfl:    folic acid (FOLVITE) 1 MG tablet, Take 1 tablet by mouth daily., Disp: , Rfl:    methotrexate (RHEUMATREX) 2.5 MG tablet, Take 6 tablets by mouth once a week., Disp: , Rfl:    Multiple Vitamin (MULTI-VITAMIN) tablet, Take 1 tablet by mouth daily., Disp: , Rfl:    predniSONE (DELTASONE) 20 MG tablet, Take 2 tablets (40 mg total) by mouth daily with breakfast., Disp: 10 tablet, Rfl: 0   predniSONE (DELTASONE) 20  MG tablet, Take 2 tablets (40 mg total) by mouth daily with breakfast., Disp: 8 tablet, Rfl: 0   valACYclovir (VALTREX) 1000 MG tablet, Take one tablet by mouth daily as needed., Disp: 30 tablet, Rfl: 2  Observations/Objective: No labored breathing. Intermittent coughing spells, hoarse voice.  Speech is clear and coherent with logical content.  Patient is alert and oriented at baseline.    Assessment and Plan: 1. Bronchitis (Primary)  Pt  still having cough, mostly bronchospastic. CXR yesterday negative. Already on two rounds of prednisone, total of 9 days worth; and augmentin x3 days then keflex (started yesterday).  Suspect bronchospasm/bronchitis, will give albuterol inhaler, pt needs nebulizer but has albuterol neb solution with refill good for two more days. Suggested she get a refill to have fresh supply, and will rx nebulizer but advised that she may need to go to a med supply store to get it.  Also rx'd tessalon perles for cough suppression.  Advised she can use OTC meds as well, but first see how Tessalon helps.  Suspect viral etiology, but already treated for bacterial, so continue the abx until completed.  Doubt need for further emergent w/up today.  F/up as needed or if symptoms worsen.   Meds ordered this encounter  Medications   Nebulizers (COMPRESSOR/NEBULIZER) MISC    Sig: Use as directed    Dispense:  1 each    Refill:  0    Supervising Provider:   Erasmo Downer [[id number]]   benzonatate (TESSALON) 200 MG capsule    Sig: Take 1 capsule (200 mg total) by mouth 3 (three) times daily as needed for cough.    Dispense:  30 capsule    Refill:  0    Supervising Provider:   Erasmo Downer [[id number]]   albuterol (VENTOLIN HFA) 108 (90 Base) MCG/ACT inhaler    Sig: Inhale 1-2 puffs into the lungs every 4 (four) hours as needed for wheezing or shortness of breath (or cough).    Dispense:  1 each    Refill:  0    Supervising Provider:   Erasmo Downer [[id number]]      Follow Up Instructions: I discussed the assessment and treatment plan with the patient. The patient was provided an opportunity to ask questions and all were answered. The patient agreed with the plan and demonstrated an understanding of the instructions.    The patient was advised to call back or seek an in-person evaluation if the symptoms worsen or if the condition fails to improve as anticipated.  Time:  I spent 20 minutes with  the patient via telehealth technology discussing the above problems/concerns.    [address], PA-C [employer]

## 2023-07-21 ENCOUNTER — Other Ambulatory Visit: Payer: Self-pay | Admitting: Oncology

## 2023-07-21 MED ORDER — PSEUDOEPHEDRINE-CODEINE-GG 30-10-100 MG/5ML PO SOLN: 10.0000 mL | Freq: Four times a day (QID) | ORAL | 0 refills | Status: DC | PRN

## 2023-07-21 NOTE — Progress Notes (Signed)
 Re: Cough   Patient calls clinic today complaining of persistent cough.  She is currently been treated with prednisone x 2, Augmentin and Keflex since 07/02/2023.  Had chest x-ray which was negative.  Reports she was recently given Tessalon pearls which have not helped much.  Is interested in a cough syrup with codeine to see if that might allow her to sleep through the night.  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  We discussed, Cheratussin cough syrup with codeine.  Recommend taking at bedtime only to cut down on drowsiness.  Do not drive or operate heavy machinery after using medication.  Continue OTC medications including inhalers and decongestants.  Durenda Hurt, NP 07/21/2023 2:12 PM

## 2023-07-22 ENCOUNTER — Ambulatory Visit (INDEPENDENT_AMBULATORY_CARE_PROVIDER_SITE_OTHER): Payer: Self-pay | Admitting: Oncology

## 2023-07-22 ENCOUNTER — Encounter: Payer: Self-pay | Admitting: Oncology

## 2023-07-22 ENCOUNTER — Other Ambulatory Visit: Payer: Self-pay

## 2023-07-22 VITALS — BP 129/92 | HR 90 | Temp 98.6°F | Ht 72.0 in

## 2023-07-22 DIAGNOSIS — R252 Cramp and spasm: Secondary | ICD-10-CM

## 2023-07-22 DIAGNOSIS — R051 Acute cough: Secondary | ICD-10-CM

## 2023-07-22 MED ORDER — HYDROCOD POLI-CHLORPHE POLI ER 10-8 MG/5ML PO SUER: 5.0000 mL | Freq: Two times a day (BID) | ORAL | 0 refills | Status: DC | PRN

## 2023-07-22 MED ORDER — CEPHALEXIN 500 MG PO CAPS: 500.0000 mg | ORAL_CAPSULE | Freq: Four times a day (QID) | ORAL | 0 refills | Status: DC

## 2023-07-22 NOTE — Progress Notes (Signed)
 [facility]  [address] [place], [place] [address] Phone: [phone] Fax: [phone]   Office Visit Note  Patient Name: Vanessa Torres  Date of [id number]  Med Rec number [id number]  Date of Service: 07/22/2023  Molds & smuts  Chief Complaint  Patient presents with   Acute Visit    Patient c/o hand and leg cramps. Symptoms began about 3 days ago. She has been drinking liquid IV and taking OTC magnesium. She went to a drip bar and had an IV with vitamins administered last week which she feels was helpful.      Patient is an 53 y.o. female here for complaints of muscle cramps in bilateral fingers and left lower extremity and foot.   Symptoms started approximately 3 to 4 days ago.  Patient went to IV hydration bar last week and had fluids along with some electrolytes replaced given she has not been feeling well.  See previous office note.  She has been treated with several different antibiotics and rounds of steroids with some improvement but having rebound symptoms.  She continues to have a cough that is worse at night.  Coughing up yellow sputum.  She is taking over-the-counter cough suppressants fairly regularly.  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  She feels like she is hydrating well and drinks liquid IVs with her water.  She was prescribed cough syrup with codeine yesterday but pharmacy stated it was on backorder.  Has hx of PE back in 2015. Was in the hospital in [place] and developed a blood clot in left leg that made it into her lung. Was on on [facility], long flight and sedentary.   Denies any fevers.  She had a chest x-ray last week which was negative for bronchitis or pneumonia.  She reports switching from Augmentin to Keflex due to significant abdominal pain and diarrhea but reports she did not take full course.  She is unsure if coming days she took.   Current Medication:  Outpatient Encounter  Medications as of 07/22/2023  Medication Sig   acetaminophen (TYLENOL) 500 MG tablet Take 1 tablet (500 mg total) by mouth every 8 (eight) hours as needed (pain). (not to exceed 8 tabs/day)   albuterol (VENTOLIN HFA) 108 (90 Base) MCG/ACT inhaler Inhale 1-2 puffs into the lungs every 4 (four) hours as needed for wheezing or shortness of breath (or cough).   benzonatate (TESSALON) 200 MG capsule Take 1 capsule (200 mg total) by mouth 3 (three) times daily as needed for cough.   chlorpheniramine-HYDROcodone (TUSSIONEX) 10-8 MG/5ML Take 5 mLs by mouth every 12 (twelve) hours as needed for cough.   EPINEPHrine 0.3 mg/0.3 mL IJ SOAJ injection Inject 0.3 mg into the muscle as needed for anaphylaxis.   etanercept (ENBREL) 50 MG/ML injection Inject 50 mg into the skin once a week.   folic acid (FOLVITE) 1 MG tablet Take 1 tablet by mouth daily.   methotrexate (RHEUMATREX) 2.5 MG tablet Take 6 tablets by mouth once a week.   Multiple Vitamin (MULTI-VITAMIN) tablet Take 1 tablet by mouth daily.   Nebulizers (COMPRESSOR/NEBULIZER) MISC Use as directed   valACYclovir (VALTREX) 1000 MG tablet Take one tablet by mouth daily as needed.   pseudoephedrine-codeine-guaifenesin (MYTUSSIN DAC) 30-10-100 MG/5ML solution Take 10 mLs by mouth 4 (four) times daily as needed for cough. (Patient not taking: Reported on 07/22/2023)   [DISCONTINUED] cephALEXin (KEFLEX) 500 MG capsule Take 1 capsule (  500 mg total) by mouth 2 (two) times daily.   [DISCONTINUED] predniSONE (DELTASONE) 20 MG tablet Take 2 tablets (40 mg total) by mouth daily with breakfast.   [DISCONTINUED] predniSONE (DELTASONE) 20 MG tablet Take 2 tablets (40 mg total) by mouth daily with breakfast.   No facility-administered encounter medications on file as of 07/22/2023.      Medical History: Past Medical History:  Diagnosis Date   Asthma    exercise induced in college    Brain aneurysm    noted MRI 2004   Collagen vascular disease (HCC)    Depression     situational   DVT (deep vein thrombosis) in pregnancy 12/2015   left calf   Fibroid    GERD (gastroesophageal reflux disease)    Herpes genitalia 1992   History of Papanicolaou smear of cervix 12/27/11; 04/18/16   -/-; neg   HSV-2 (herpes simplex virus 2) infection    Hyperlipidemia    Lipoma    left shoulder removed    Ovarian cyst    Premature ovarian failure 2013   Dr. Janene Harvey, GYN. Symptoms have resolved   Pulmonary embolism (HCC) 11/2015   with DVT per pt provoked on international [place] and was on OCP   Rheumatoid arthritis (HCC)    rheumatoid dx 2015 f/u rheumatology Dr. Patrica Duel [facility] worse hands, feet, L thumb   TMJ (dislocation of temporomandibular joint)      Vital Signs: BP (!) 129/92   Pulse 90   Temp 98.6 F (37 C)   Ht 6' (1.829 m)   LMP 12/22/2015   SpO2 98%   BMI 23.73 kg/m   ROS: As per HPI.  All other pertinent ROS negative.     Review of Systems  Constitutional:  Positive for fatigue.  HENT:  Positive for congestion.   Respiratory:  Positive for cough and wheezing.   Gastrointestinal:  Negative for diarrhea, nausea and vomiting.  Neurological:  Positive for weakness and numbness.       Cramping to hands and left lower leg and foot  Psychiatric/Behavioral:  Positive for sleep disturbance.     Physical Exam Constitutional:      Appearance: Normal appearance.  Cardiovascular:     Rate and Rhythm: Normal rate and regular rhythm.  Pulmonary:     Effort: Pulmonary effort is normal.     Breath sounds: Normal breath sounds.  Neurological:     Mental Status: She is alert.     No results found for this or any previous visit (from the past 24 hours).  Assessment/Plan: 1. Hand cramps (Primary) We discussed collecting labs today to determine if they electrolyte derangements are present.  Given how sick she has been over the past month, she potentially could be slightly dehydrated.  We discussed continuing liquid IV and other liquids containing  electrolytes.  Will call with results.  - Comprehensive metabolic panel - Magnesium - Folate - CBC with Differential/Platelet - Vitamin B12  2. Acute cough Patient has been treated with 2 rounds of antibiotics which was recently switched from Augmentin to Keflex due to GI side effects.  Reports she did not complete that for course of the Keflex.  She also has had 2 rounds of steroids and has been on numerous decongestants and cough suppressants.  We discussed calling in Tussinex with codeine.  We also discussed completing course of Keflex given she started it and only took for a couple of days.  Will send in additional 2  to 3 days worth so she can get a full 10-day course.  Keep me updated on your symptoms.  I have also added a CBC to see if this potentially is still a bacterial infection although chest x-ray was negative.  - chlorpheniramine-HYDROcodone (TUSSIONEX) 10-8 MG/5ML; Take 5 mLs by mouth every 12 (twelve) hours as needed for cough.  Dispense: 120 mL; Refill: 0 - cephALEXin (KEFLEX) 500 MG capsule; Take 1 capsule (500 mg total) by mouth 4 (four) times daily.  Dispense: 20 capsule; Refill: 0   General Counseling: Nohelia verbalizes understanding of the findings of todays visit and agrees with plan of treatment. I have discussed any further diagnostic evaluation that may be needed or ordered today. We also reviewed her medications today. she has been encouraged to call the office with any questions or concerns that should arise related to todays visit.   Orders Placed This Encounter  Procedures   Comprehensive metabolic panel   Magnesium   Folate   CBC with Differential/Platelet   Vitamin B12    Meds ordered this encounter  Medications   chlorpheniramine-HYDROcodone (TUSSIONEX) 10-8 MG/5ML    Sig: Take 5 mLs by mouth every 12 (twelve) hours as needed for cough.    Dispense:  120 mL    Refill:  0    I spent 20 minutes dedicated to the care of this patient (face-to-face and  non-face-to-face) on the date of the encounter to include what is described in the assessment and plan.   Durenda Hurt, NP 07/22/2023 9:18 AM

## 2023-07-23 ENCOUNTER — Encounter: Payer: Self-pay | Admitting: Oncology

## 2023-07-23 LAB — VITAMIN B12: Vitamin B-12: 1213 pg/mL (ref 232–1245)

## 2023-07-25 LAB — COMPREHENSIVE METABOLIC PANEL
ALT: 28 IU/L (ref 0–32)
AST: 31 IU/L (ref 0–40)
Albumin: 4.2 g/dL (ref 3.8–4.9)
Alkaline Phosphatase: 73 IU/L (ref 44–121)
BUN/Creatinine Ratio: 13 (ref 9–23)
BUN: 12 mg/dL (ref 6–24)
Bilirubin Total: 0.5 mg/dL (ref 0.0–1.2)
CO2: 23 mmol/L (ref 20–29)
Calcium: 9.6 mg/dL (ref 8.7–10.2)
Chloride: 105 mmol/L (ref 96–106)
Creatinine, Ser: 0.93 mg/dL (ref 0.57–1.00)
Globulin, Total: 2.6 g/dL (ref 1.5–4.5)
Glucose: 85 mg/dL (ref 70–99)
Potassium: 4 mmol/L (ref 3.5–5.2)
Sodium: 143 mmol/L (ref 134–144)
Total Protein: 6.8 g/dL (ref 6.0–8.5)
eGFR: 74 mL/min/{1.73_m2} (ref >59)

## 2023-07-25 LAB — CBC WITH DIFFERENTIAL/PLATELET
Basophils Absolute: 0 10*3/uL (ref 0.0–0.2)
Basos: 0 %
EOS (ABSOLUTE): 0.1 10*3/uL (ref 0.0–0.4)
Eos: 2 %
Hematocrit: 40.1 % (ref 34.0–46.6)
Hemoglobin: 13 g/dL (ref 11.1–15.9)
Immature Grans (Abs): 0 10*3/uL (ref 0.0–0.1)
Immature Granulocytes: 0 %
Lymphocytes Absolute: 1.9 10*3/uL (ref 0.7–3.1)
Lymphs: 38 %
MCH: 26.2 pg — ABNORMAL LOW (ref 26.6–33.0)
MCHC: 32.4 g/dL (ref 31.5–35.7)
MCV: 81 fL (ref 79–97)
Monocytes Absolute: 0.5 10*3/uL (ref 0.1–0.9)
Monocytes: 10 %
Neutrophils Absolute: 2.5 10*3/uL (ref 1.4–7.0)
Neutrophils: 50 %
Platelets: 215 10*3/uL (ref 150–450)
RBC: 4.96 x10E6/uL (ref 3.77–5.28)
RDW: 15.4 % (ref 11.7–15.4)
WBC: 5.1 10*3/uL (ref 3.4–10.8)

## 2023-07-25 LAB — MAGNESIUM: Magnesium: 2 mg/dL (ref 1.6–2.3)

## 2023-07-25 LAB — FOLATE: Folate: 8 ng/mL (ref >3.0)

## 2023-08-06 ENCOUNTER — Other Ambulatory Visit: Payer: Self-pay | Admitting: Adult Health

## 2023-08-06 ENCOUNTER — Other Ambulatory Visit: Payer: Self-pay

## 2023-08-06 DIAGNOSIS — R5383 Other fatigue: Secondary | ICD-10-CM

## 2023-08-07 ENCOUNTER — Encounter: Payer: Self-pay | Admitting: Adult Health

## 2023-08-07 LAB — IRON AND TIBC
Iron Saturation: 24 % (ref 15–55)
Iron: 88 ug/dL (ref 27–159)
Total Iron Binding Capacity: 372 ug/dL (ref 250–450)
UIBC: 284 ug/dL (ref 131–425)

## 2023-08-07 LAB — FERRITIN: Ferritin: 50 ng/mL (ref 15–150)

## 2023-08-07 LAB — VITAMIN D 25 HYDROXY (VIT D DEFICIENCY, FRACTURES): Vit D, 25-Hydroxy: 40.8 ng/mL (ref 30.0–100.0)

## 2023-08-07 LAB — TSH: TSH: 0.893 u[IU]/mL (ref 0.450–4.500)

## 2023-09-03 ENCOUNTER — Other Ambulatory Visit: Payer: Self-pay

## 2023-09-03 ENCOUNTER — Ambulatory Visit (INDEPENDENT_AMBULATORY_CARE_PROVIDER_SITE_OTHER): Payer: Self-pay | Admitting: Adult Health

## 2023-09-03 ENCOUNTER — Encounter: Payer: Self-pay | Admitting: Adult Health

## 2023-09-03 VITALS — BP 100/82 | HR 74 | Temp 99.1°F | Ht 72.0 in

## 2023-09-03 DIAGNOSIS — L03113 Cellulitis of right upper limb: Secondary | ICD-10-CM

## 2023-09-03 MED ORDER — CEPHALEXIN 500 MG PO CAPS: 500.0000 mg | ORAL_CAPSULE | Freq: Two times a day (BID) | ORAL | 0 refills | Status: AC

## 2023-09-03 MED ORDER — CEFTRIAXONE SODIUM 500 MG IJ SOLR: 500.0000 mg | Freq: Once | INTRAMUSCULAR | Status: AC

## 2023-09-03 NOTE — Progress Notes (Signed)
 [facility] [address] [place], [place] [address]   Office Visit Note  Patient Name: Vanessa Torres Date of Birth [id number]  Medical Record number [id number]  Date of Service: 09/03/2023  Chief Complaint  Patient presents with   Acute Visit    Patient reports swelling, redness, and itching on the inside of her R arm since yesterday. She is concerned that she may have been bitten by a spider. Symptoms began yesterday. She applied Neosporin and used Benadryl spray last night.      HPI Pt is here for a sick visit. She reports she noticed her right arm was itchy, red and swollen.  She did not see any insect.  She has swelling,redness and some pressure/squeezing in the arm.    Current Medication:  Outpatient Encounter Medications as of 09/03/2023  Medication Sig   acetaminophen (TYLENOL) 500 MG tablet Take 1 tablet (500 mg total) by mouth every 8 (eight) hours as needed (pain). (not to exceed 8 tabs/day)   albuterol (VENTOLIN HFA) 108 (90 Base) MCG/ACT inhaler Inhale 1-2 puffs into the lungs every 4 (four) hours as needed for wheezing or shortness of breath (or cough).   cephALEXin (KEFLEX) 500 MG capsule Take 1 capsule (500 mg total) by mouth 2 (two) times daily.   EPINEPHrine 0.3 mg/0.3 mL IJ SOAJ injection Inject 0.3 mg into the muscle as needed for anaphylaxis.   etanercept (ENBREL) 50 MG/ML injection Inject 50 mg into the skin once a week.   folic acid (FOLVITE) 1 MG tablet Take 1 tablet by mouth daily.   methotrexate (RHEUMATREX) 2.5 MG tablet Take 6 tablets by mouth once a week.   Multiple Vitamin (MULTI-VITAMIN) tablet Take 1 tablet by mouth daily.   Nebulizers (COMPRESSOR/NEBULIZER) MISC Use as directed   valACYclovir (VALTREX) 1000 MG tablet Take one tablet by mouth daily as needed.   [DISCONTINUED] benzonatate (TESSALON) 200 MG capsule Take 1 capsule (200 mg total) by mouth 3 (three) times daily as needed for cough.   [DISCONTINUED] cephALEXin (KEFLEX) 500 MG  capsule Take 1 capsule (500 mg total) by mouth 4 (four) times daily.   [DISCONTINUED] chlorpheniramine-HYDROcodone (TUSSIONEX) 10-8 MG/5ML Take 5 mLs by mouth every 12 (twelve) hours as needed for cough.   [DISCONTINUED] pseudoephedrine-codeine-guaifenesin (MYTUSSIN DAC) 30-10-100 MG/5ML solution Take 10 mLs by mouth 4 (four) times daily as needed for cough. (Patient not taking: Reported on 07/22/2023)   Facility-Administered Encounter Medications as of 09/03/2023  Medication   cefTRIAXone (ROCEPHIN) injection 500 mg      Medical History: Past Medical History:  Diagnosis Date   Asthma    exercise induced in college    Brain aneurysm    noted MRI 2004   Collagen vascular disease (HCC)    Depression    situational   DVT (deep vein thrombosis) in pregnancy 12/2015   left calf   Fibroid    GERD (gastroesophageal reflux disease)    Herpes genitalia 1992   History of Papanicolaou smear of cervix 12/27/11; 04/18/16   -/-; neg   HSV-2 (herpes simplex virus 2) infection    Hyperlipidemia    Lipoma    left shoulder removed    Ovarian cyst    Premature ovarian failure 2013   Dr. Arleta Bench, GYN. Symptoms have resolved   Pulmonary embolism (HCC) 11/2015   with DVT per pt provoked on international 8 hr flight and was on OCP   Rheumatoid arthritis (HCC)    rheumatoid dx 2015 f/u rheumatology Dr.  Hugo Maes [facility] worse hands, feet, L thumb   TMJ (dislocation of temporomandibular joint)      Vital Signs: BP 100/82   Pulse 74   Temp 99.1 F (37.3 C)   Ht 6' (1.829 m)   LMP 12/22/2015   SpO2 99%   BMI 23.73 kg/m    Review of Systems  Constitutional:  Negative for chills, fatigue and fever.  Eyes:  Negative for pain and itching.  Skin:  Positive for color change and wound.    Physical Exam Vitals reviewed.  Constitutional:      Appearance: Normal appearance.  HENT:     Head: Normocephalic.  Neurological:     Mental Status: She is alert.    Assessment/Plan: 1. Cellulitis of  right upper extremity (Primary) Take Keflex, and follow up in AM.  Discussed ED precautions.  - cephALEXin (KEFLEX) 500 MG capsule; Take 1 capsule (500 mg total) by mouth 2 (two) times daily.  Dispense: 20 capsule; Refill: 0 - cefTRIAXone (ROCEPHIN) injection 500 mg        General Counseling: Vanessa Torres verbalizes understanding of the findings of todays visit and agrees with plan of treatment. I have discussed any further diagnostic evaluation that may be needed or ordered today. We also reviewed her medications today. she has been encouraged to call the office with any questions or concerns that should arise related to todays visit.   No orders of the defined types were placed in this encounter.   Meds ordered this encounter  Medications   cephALEXin (KEFLEX) 500 MG capsule    Sig: Take 1 capsule (500 mg total) by mouth 2 (two) times daily.    Dispense:  20 capsule    Refill:  0   cefTRIAXone (ROCEPHIN) injection 500 mg    Time spent:15 Minutes    Vanessa Torres J. Nadeem Romanoski AGNP-C Nurse Practitioner

## 2023-09-04 ENCOUNTER — Encounter: Payer: Self-pay | Admitting: Adult Health

## 2023-09-04 ENCOUNTER — Ambulatory Visit (INDEPENDENT_AMBULATORY_CARE_PROVIDER_SITE_OTHER): Payer: Self-pay | Admitting: Adult Health

## 2023-09-04 VITALS — BP 114/79 | HR 70 | Temp 97.7°F

## 2023-09-04 DIAGNOSIS — L03113 Cellulitis of right upper limb: Secondary | ICD-10-CM

## 2023-09-04 NOTE — Progress Notes (Signed)
 [facility] [address] [place], [place] [address]   Office Visit Note  Patient Name: Vanessa Torres Date of Birth [id number]  Medical Record number [id number]  Date of Service: 09/04/2023  Chief Complaint  Patient presents with   Follow-up    Patient feels her arm has been improving since starting the antibiotic yesterday.     HPI Pt is here for follow up on cellulitis.  She has had some decrease in swelling.  Redness seems to be the same She had two doses of oral antibiotics yesterday, as well as the shot of rocephin.    Current Medication:  Outpatient Encounter Medications as of 09/04/2023  Medication Sig   acetaminophen (TYLENOL) 500 MG tablet Take 1 tablet (500 mg total) by mouth every 8 (eight) hours as needed (pain). (not to exceed 8 tabs/day)   albuterol (VENTOLIN HFA) 108 (90 Base) MCG/ACT inhaler Inhale 1-2 puffs into the lungs every 4 (four) hours as needed for wheezing or shortness of breath (or cough).   cephALEXin (KEFLEX) 500 MG capsule Take 1 capsule (500 mg total) by mouth 2 (two) times daily.   EPINEPHrine 0.3 mg/0.3 mL IJ SOAJ injection Inject 0.3 mg into the muscle as needed for anaphylaxis.   etanercept (ENBREL) 50 MG/ML injection Inject 50 mg into the skin once a week.   folic acid (FOLVITE) 1 MG tablet Take 1 tablet by mouth daily.   methotrexate (RHEUMATREX) 2.5 MG tablet Take 6 tablets by mouth once a week.   Multiple Vitamin (MULTI-VITAMIN) tablet Take 1 tablet by mouth daily.   Nebulizers (COMPRESSOR/NEBULIZER) MISC Use as directed   valACYclovir (VALTREX) 1000 MG tablet Take one tablet by mouth daily as needed.   Facility-Administered Encounter Medications as of 09/04/2023  Medication   cefTRIAXone (ROCEPHIN) injection 500 mg      Medical History: Past Medical History:  Diagnosis Date   Asthma    exercise induced in college    Brain aneurysm    noted MRI 2004   Collagen vascular disease (HCC)    Depression    situational   DVT  (deep vein thrombosis) in pregnancy 12/2015   left calf   Fibroid    GERD (gastroesophageal reflux disease)    Herpes genitalia 1992   History of Papanicolaou smear of cervix 12/27/11; 04/18/16   -/-; neg   HSV-2 (herpes simplex virus 2) infection    Hyperlipidemia    Lipoma    left shoulder removed    Ovarian cyst    Premature ovarian failure 2013   Dr. Janene Harvey, GYN. Symptoms have resolved   Pulmonary embolism (HCC) 11/2015   with DVT per pt provoked on international 8 hr flight and was on OCP   Rheumatoid arthritis (HCC)    rheumatoid dx 2015 f/u rheumatology Dr. Patrica Duel [facility] worse hands, feet, L thumb   TMJ (dislocation of temporomandibular joint)      Vital Signs: BP 114/79   Pulse 70   Temp 97.7 F (36.5 C)   LMP 12/22/2015   SpO2 95%    Review of Systems  Constitutional:  Negative for chills, fatigue and fever.  Skin:  Positive for wound.    Physical Exam Vitals reviewed.  Constitutional:      Appearance: Normal appearance.  HENT:     Head: Normocephalic.  Skin:    Findings: Erythema present.     Comments: Right upper extremity cellulitis.   Neurological:     Mental Status: She is alert.  Assessment/Plan: 1. Cellulitis of right upper extremity (Primary) Continue antibiotics.  Once again discussed possible need for IV antibiotics. Given that there has been some improvement in swelling, will wait until tomorrow morning to re-evaluate.      General Counseling: Vanessa Torres understanding of the findings of todays visit and agrees with plan of treatment. I have discussed any further diagnostic evaluation that may be needed or ordered today. We also reviewed her medications today. she has been encouraged to call the office with any questions or concerns that should arise related to todays visit.   No orders of the defined types were placed in this encounter.   No orders of the defined types were placed in this encounter.   Time spent:15  Minutes    Vanessa Torres AGNP-C Nurse Practitioner

## 2023-09-05 ENCOUNTER — Telehealth: Payer: Self-pay

## 2023-09-05 NOTE — Telephone Encounter (Signed)
 Called patient to see if there is any improvement in her symptoms today after being treated for cellulitis of the R arm. Patient states the swelling and redness are going down and it no longer feels warm to the touch. Advised patient to continue antibiotic tx and to go to the ER if symptoms worsen as discussed in office. Patient expressed gratitude for the call.

## 2023-12-10 DIAGNOSIS — M059 Rheumatoid arthritis with rheumatoid factor, unspecified: Principal | ICD-10-CM

## 2023-12-10 MED ORDER — ENBREL SURECLICK 50 MG/ML (1 ML) SUBCUTANEOUS PEN INJECTOR
0.00000 days
Start: 2023-12-10 — End: ?

## 2023-12-11 MED ORDER — ENBREL SURECLICK 50 MG/ML (1 ML) SUBCUTANEOUS PEN INJECTOR
SUBCUTANEOUS | 3 refills | 0.00000 days | Status: CP
Start: 2023-12-11 — End: ?

## 2024-02-27 ENCOUNTER — Ambulatory Visit: Payer: Self-pay | Admitting: Physician Assistant

## 2024-03-10 ENCOUNTER — Ambulatory Visit: Admit: 2024-03-10 | Discharge: 2024-03-10 | Payer: BLUE CROSS/BLUE SHIELD

## 2024-03-10 DIAGNOSIS — M0630 Rheumatoid nodule, unspecified site: Principal | ICD-10-CM

## 2024-03-10 DIAGNOSIS — M059 Rheumatoid arthritis with rheumatoid factor, unspecified: Principal | ICD-10-CM

## 2024-03-10 DIAGNOSIS — Z79631 Methotrexate, long term, current use: Principal | ICD-10-CM

## 2024-03-10 MED ORDER — METHOTREXATE SODIUM 2.5 MG TABLET
ORAL_TABLET | ORAL | 3 refills | 84.00000 days | Status: CP
Start: 2024-03-10 — End: ?

## 2024-06-11 DIAGNOSIS — M0579 Rheumatoid arthritis with rheumatoid factor of multiple sites without organ or systems involvement: Principal | ICD-10-CM
